# Patient Record
Sex: Female | Born: 1975 | Race: Black or African American | Hispanic: No | Marital: Married | Smoking: Never smoker
Health system: Southern US, Community
[De-identification: ages and names within clinical notes are randomized; demographics above are authoritative.]

## PROBLEM LIST (undated history)

## (undated) DIAGNOSIS — R7303 Prediabetes: Secondary | ICD-10-CM

## (undated) DIAGNOSIS — Z87410 Personal history of cervical dysplasia: Secondary | ICD-10-CM

## (undated) DIAGNOSIS — H04129 Dry eye syndrome of unspecified lacrimal gland: Secondary | ICD-10-CM

## (undated) DIAGNOSIS — G510 Bell's palsy: Secondary | ICD-10-CM

## (undated) DIAGNOSIS — Z87898 Personal history of other specified conditions: Secondary | ICD-10-CM

## (undated) DIAGNOSIS — Z8619 Personal history of other infectious and parasitic diseases: Secondary | ICD-10-CM

## (undated) DIAGNOSIS — K601 Chronic anal fissure: Secondary | ICD-10-CM

## (undated) DIAGNOSIS — E785 Hyperlipidemia, unspecified: Secondary | ICD-10-CM

## (undated) HISTORY — DX: Prediabetes: R73.03

## (undated) HISTORY — DX: Hyperlipidemia, unspecified: E78.5

## (undated) HISTORY — PX: TUBAL LIGATION: SHX77

## (undated) HISTORY — PX: OTHER SURGICAL HISTORY: SHX169

---
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
## 2002-02-18 HISTORY — PX: WISDOM TOOTH EXTRACTION: SHX21

## 2006-01-13 ENCOUNTER — Ambulatory Visit: Payer: Self-pay | Admitting: Internal Medicine

## 2006-01-13 LAB — CONVERTED CEMR LAB
ALT: 16 units/L (ref 0–40)
AST: 19 units/L (ref 0–37)
Albumin: 3.7 g/dL (ref 3.5–5.2)
Alkaline Phosphatase: 35 units/L — ABNORMAL LOW (ref 39–117)
BUN: 14 mg/dL (ref 6–23)
Basophils Absolute: 0 10*3/uL (ref 0.0–0.1)
Basophils Relative: 0.2 % (ref 0.0–1.0)
Bilirubin Urine: NEGATIVE
CO2: 24 meq/L (ref 19–32)
Calcium: 8.8 mg/dL (ref 8.4–10.5)
Chloride: 108 meq/L (ref 96–112)
Chol/HDL Ratio, serum: 3.6
Cholesterol: 239 mg/dL (ref 0–200)
Creatinine, Ser: 0.8 mg/dL (ref 0.4–1.2)
Eosinophil percent: 0.8 % (ref 0.0–5.0)
GFR calc non Af Amer: 91 mL/min
Glomerular Filtration Rate, Af Am: 110 mL/min/{1.73_m2}
Glucose, Bld: 91 mg/dL (ref 70–99)
HCT: 37.5 % (ref 36.0–46.0)
HDL: 65.5 mg/dL (ref >39.0)
Hemoglobin: 12.5 g/dL (ref 12.0–15.0)
Ketones, ur: NEGATIVE mg/dL
LDL DIRECT: 176.1 mg/dL
Leukocytes, UA: NEGATIVE
Lymphocytes Relative: 25.7 % (ref 12.0–46.0)
MCHC: 33.4 g/dL (ref 30.0–36.0)
MCV: 94.6 fL (ref 78.0–100.0)
Monocytes Absolute: 0.3 10*3/uL (ref 0.2–0.7)
Monocytes Relative: 4.8 % (ref 3.0–11.0)
Neutro Abs: 4 10*3/uL (ref 1.4–7.7)
Neutrophils Relative %: 68.5 % (ref 43.0–77.0)
Nitrite: NEGATIVE
Platelets: 235 10*3/uL (ref 150–400)
Potassium: 4.1 meq/L (ref 3.5–5.1)
RBC: 3.97 M/uL (ref 3.87–5.11)
RDW: 12.3 % (ref 11.5–14.6)
Sodium: 140 meq/L (ref 135–145)
Specific Gravity, Urine: 1.02 (ref 1.000–1.03)
TSH: 1.73 microintl units/mL (ref 0.35–5.50)
Total Bilirubin: 0.9 mg/dL (ref 0.3–1.2)
Total Protein, Urine: NEGATIVE mg/dL
Total Protein: 7.1 g/dL (ref 6.0–8.3)
Triglyceride fasting, serum: 53 mg/dL (ref 0–149)
Urine Glucose: NEGATIVE mg/dL
Urobilinogen, UA: 1 (ref 0.0–1.0)
VLDL: 11 mg/dL (ref 0–40)
WBC: 5.8 10*3/uL (ref 4.5–10.5)
pH: 6.5 (ref 5.0–8.0)

## 2006-04-21 ENCOUNTER — Ambulatory Visit: Payer: Self-pay | Admitting: Internal Medicine

## 2006-06-09 ENCOUNTER — Ambulatory Visit: Payer: Self-pay | Admitting: Internal Medicine

## 2006-06-09 LAB — CONVERTED CEMR LAB
ALT: 13 units/L (ref 0–40)
AST: 16 units/L (ref 0–37)
Albumin: 3.4 g/dL — ABNORMAL LOW (ref 3.5–5.2)
Alkaline Phosphatase: 43 units/L (ref 39–117)
BUN: 12 mg/dL (ref 6–23)
Basophils Absolute: 0 10*3/uL (ref 0.0–0.1)
Basophils Relative: 0.1 % (ref 0.0–1.0)
Bilirubin Urine: NEGATIVE
Bilirubin, Direct: 0.1 mg/dL (ref 0.0–0.3)
CO2: 26 meq/L (ref 19–32)
Calcium: 8.8 mg/dL (ref 8.4–10.5)
Chloride: 109 meq/L (ref 96–112)
Cholesterol: 220 mg/dL (ref 0–200)
Creatinine, Ser: 0.7 mg/dL (ref 0.4–1.2)
Crystals: NEGATIVE
Direct LDL: 149 mg/dL
Eosinophils Absolute: 0.1 10*3/uL (ref 0.0–0.6)
Eosinophils Relative: 1.2 % (ref 0.0–5.0)
GFR calc Af Amer: 127 mL/min
GFR calc non Af Amer: 105 mL/min
Glucose, Bld: 84 mg/dL (ref 70–99)
HCT: 34.5 % — ABNORMAL LOW (ref 36.0–46.0)
HDL: 54.7 mg/dL (ref >39.0)
Hemoglobin: 12.1 g/dL (ref 12.0–15.0)
Ketones, ur: NEGATIVE mg/dL
Leukocytes, UA: NEGATIVE
Lymphocytes Relative: 27.7 % (ref 12.0–46.0)
MCHC: 35.1 g/dL (ref 30.0–36.0)
MCV: 93 fL (ref 78.0–100.0)
Monocytes Absolute: 0.3 10*3/uL (ref 0.2–0.7)
Monocytes Relative: 4.4 % (ref 3.0–11.0)
Mucus, UA: NEGATIVE
Neutro Abs: 4.2 10*3/uL (ref 1.4–7.7)
Neutrophils Relative %: 66.6 % (ref 43.0–77.0)
Nitrite: NEGATIVE
Platelets: 180 10*3/uL (ref 150–400)
Potassium: 4.1 meq/L (ref 3.5–5.1)
RBC: 3.71 M/uL — ABNORMAL LOW (ref 3.87–5.11)
RDW: 12.2 % (ref 11.5–14.6)
Sodium: 142 meq/L (ref 135–145)
Specific Gravity, Urine: 1.02 (ref 1.000–1.03)
TSH: 1.7 microintl units/mL (ref 0.35–5.50)
Total Bilirubin: 0.7 mg/dL (ref 0.3–1.2)
Total CHOL/HDL Ratio: 4
Total Protein, Urine: NEGATIVE mg/dL
Total Protein: 6.6 g/dL (ref 6.0–8.3)
Triglycerides: 92 mg/dL (ref 0–149)
Urine Glucose: NEGATIVE mg/dL
Urobilinogen, UA: 0.2 (ref 0.0–1.0)
VLDL: 18 mg/dL (ref 0–40)
WBC, UA: NONE SEEN cells/hpf
WBC: 6.4 10*3/uL (ref 4.5–10.5)
pH: 6.5 (ref 5.0–8.0)

## 2006-06-16 ENCOUNTER — Ambulatory Visit: Payer: Self-pay | Admitting: Internal Medicine

## 2006-11-06 ENCOUNTER — Encounter: Payer: Self-pay | Admitting: Internal Medicine

## 2006-11-06 DIAGNOSIS — K649 Unspecified hemorrhoids: Secondary | ICD-10-CM | POA: Insufficient documentation

## 2006-11-06 DIAGNOSIS — Z8679 Personal history of other diseases of the circulatory system: Secondary | ICD-10-CM | POA: Insufficient documentation

## 2008-05-13 ENCOUNTER — Encounter (INDEPENDENT_AMBULATORY_CARE_PROVIDER_SITE_OTHER): Payer: Self-pay | Admitting: Obstetrics and Gynecology

## 2008-05-13 ENCOUNTER — Inpatient Hospital Stay (HOSPITAL_COMMUNITY): Admission: AD | Admit: 2008-05-13 | Discharge: 2008-05-15 | Payer: Self-pay | Admitting: Obstetrics and Gynecology

## 2008-05-20 ENCOUNTER — Ambulatory Visit: Admission: RE | Admit: 2008-05-20 | Discharge: 2008-05-20 | Payer: Self-pay | Admitting: Obstetrics and Gynecology

## 2008-05-20 ENCOUNTER — Encounter: Admission: RE | Admit: 2008-05-20 | Discharge: 2008-05-27 | Payer: Self-pay | Admitting: Obstetrics and Gynecology

## 2009-06-01 DIAGNOSIS — IMO0002 Reserved for concepts with insufficient information to code with codable children: Secondary | ICD-10-CM | POA: Insufficient documentation

## 2009-06-01 DIAGNOSIS — R87619 Unspecified abnormal cytological findings in specimens from cervix uteri: Secondary | ICD-10-CM | POA: Insufficient documentation

## 2009-07-05 DIAGNOSIS — Z9889 Other specified postprocedural states: Secondary | ICD-10-CM | POA: Insufficient documentation

## 2009-08-04 ENCOUNTER — Ambulatory Visit (HOSPITAL_COMMUNITY): Admission: RE | Admit: 2009-08-04 | Discharge: 2009-08-04 | Payer: Self-pay | Admitting: Obstetrics and Gynecology

## 2010-05-06 LAB — CBC
HCT: 39 % (ref 36.0–46.0)
Hemoglobin: 13.2 g/dL (ref 12.0–15.0)
MCHC: 33.8 g/dL (ref 30.0–36.0)
MCV: 95.6 fL (ref 78.0–100.0)
Platelets: 183 10*3/uL (ref 150–400)
RBC: 4.08 MIL/uL (ref 3.87–5.11)
RDW: 13.3 % (ref 11.5–15.5)
WBC: 6.8 10*3/uL (ref 4.0–10.5)

## 2010-05-31 LAB — CBC
HCT: 28.6 % — ABNORMAL LOW (ref 36.0–46.0)
HCT: 35 % — ABNORMAL LOW (ref 36.0–46.0)
Hemoglobin: 11.9 g/dL — ABNORMAL LOW (ref 12.0–15.0)
Hemoglobin: 9.7 g/dL — ABNORMAL LOW (ref 12.0–15.0)
MCHC: 33.8 g/dL (ref 30.0–36.0)
MCHC: 34 g/dL (ref 30.0–36.0)
MCV: 97.3 fL (ref 78.0–100.0)
MCV: 98 fL (ref 78.0–100.0)
Platelets: 126 10*3/uL — ABNORMAL LOW (ref 150–400)
Platelets: 163 10*3/uL (ref 150–400)
RBC: 2.92 MIL/uL — ABNORMAL LOW (ref 3.87–5.11)
RBC: 3.6 MIL/uL — ABNORMAL LOW (ref 3.87–5.11)
RDW: 13.7 % (ref 11.5–15.5)
RDW: 14.2 % (ref 11.5–15.5)
WBC: 11 10*3/uL — ABNORMAL HIGH (ref 4.0–10.5)
WBC: 9.6 10*3/uL (ref 4.0–10.5)

## 2010-05-31 LAB — RPR: RPR Ser Ql: NONREACTIVE

## 2010-07-03 NOTE — Op Note (Signed)
Veronica Villanueva, Veronica Villanueva               ACCOUNT NO.:  [id number]   MEDICAL RECORD NO.:  [id number]          PATIENT TYPE:  INP   LOCATION:  9104                          [facility]:  [facility]   PHYSICIAN:  Hal Morales, M.D.DATE OF BIRTH:  [birth year]   DATE OF PROCEDURE:  05/13/2008  DATE OF DISCHARGE:                               OPERATIVE REPORT   PREOPERATIVE DIAGNOSIS:  Desire for surgical sterilization.   POSTOPERATIVE DIAGNOSIS:  Desire for surgical sterilization.   OPERATION:  Postpartum tubal sterilization.   SURGEON:  Hal Morales, MD   ANESTHESIA:  Epidural.   ESTIMATED BLOOD LOSS:  Less than 10 mL.   COMPLICATIONS:  None.   FINDINGS:  The tubes appeared normal for the postpartum state.   PROCEDURE:  The patient was taken to the operating room with her labor  epidural in place.  She was placed in the supine position on the  operating table after appropriate identification.  Her labor epidural  was dosed for surgical anesthesia.  The abdomen and perineum were  prepped with multiple layers of Betadine.  The bladder was emptied with  an in-and-out catheter.  The abdomen was draped as a sterile field.  After assurance of adequate anesthesia, subumbilical injection of 10 mL  of 0.25% Marcaine was undertaken.  A subumbilical incision was made.  The peritoneum was entered bluntly and [employer] retractors placed.  The  left fallopian tube was then identified, followed to its fimbriated end,  and grasped at the isthmic portion and elevated.  A suture of 2-0  chromic was placed through the mesosalpinx and tied fore-and-aft on the  knuckle of tube.  A second ligature was placed proximal to that and the  intervening knuckle of tube excised.  The cut ends were cauterized.  A  similar procedure was carried out on the opposite side.  Hemostasis was  noted to be adequate.  The abdominal peritoneum was closed in a  pursestring fashion with 0 Vicryl.  The fascia was closed in a  running  fashion with 0 Vicryl.  The skin incision was closed with a subcuticular  suture of 3-0 Vicryl.  A sterile dressing was applied.  The patient was  taken from the operating room to the recovery room in satisfactory  condition having tolerated the procedure well with sponge and instrument  counts correct.   SPECIMENS TO PATHOLOGY:  Portions of right and left fallopian tube.     Hal Morales, M.D.  Electronically Signed    VPH/MEDQ  D:  05/13/2008  T:  05/14/2008  Job:  [id number]

## 2010-07-03 NOTE — H&P (Signed)
Veronica Villanueva, Veronica Villanueva               ACCOUNT NO.:  [id number]   MEDICAL RECORD NO.:  [id number]          PATIENT TYPE:  INP   LOCATION:  9198                          [facility]  [facility]   PHYSICIAN:  Osborn Coho, M.D.   DATE OF BIRTH:  Mar 14, 1975   DATE OF ADMISSION:  05/13/2008  DATE OF DISCHARGE:                              HISTORY & PHYSICAL   The patient is a 35 year old married African American female, gravida 4,  para 1-0-2-1 at 69 weeks' gestation per an [facility] of May 27, 2008, who  presents with chief complaint of spontaneous rupture of membranes at  4:20 a.m. and subsequent onset of contractions.  She does desire a  bilateral tubal ligation and has 30-day papers on her prenatal record.  She also is requesting an epidural.  History remarkable for first and  second trimester vaginal bleeding.  Denies any vaginal bleeding but did  note some pink tinge to the fluid that she had been leaking; overall,  though, has been clear.  Denies PIH or UTI signs or symptoms, nausea,  vomiting or diarrhea.  Also, no respiratory complaints.  She had been  followed by Dr. Pennie Rushing at [facility] and history has been  remarkable for:  1. A history of postpartum depression which did not require any      medication.  2. History of abnormal Pap 2008; repeat was within normal limits.  3. History of STDs.  4. Hemorrhoids.  5. History of oligohydramnios with her first child and was induced      secondary to that.   PAST MEDICAL HISTORY:  ALLERGIES:  She denies any medication allergies  or latex allergies but does have sensitivity to pineapple and jalapenos.   MENSTRUAL HISTORY:  Menarche at age 12, monthly cycles, does report some  bad cramping since her miscarriage in January 2009.  She had a certain  LMP of August 21, 2007, giving her best [facility] of May 27, 2008, which was  confirmed by an ultrasound October 21, 2007; it gave an [facility] of May 28, 2008.   OBSTETRICAL HISTORY:  Rhett Bannister 1 was  an elective abortion in 1998.  Gravida 2 was a spontaneous vaginal delivery June 2001, a female named  Oretha Caprice, weighed 5 pounds 6 ounces.  Was born somewhere between 36-37  weeks.  The patient is not exactly sure, and she did have an epidural.  She was induced secondary to oligohydramnios.  Gravida 3 was a  spontaneous abortion in January 2009.  Gravida 4 current pregnancy.   PAST MEDICAL HISTORY:  Continued:  She did have postpartum depression  after Darin was born but on no medications.   Contraceptive use in the past:  She had used OCPs and stopped greater  than a year before, her third pregnancy.  Abnormal Pap in 208; repeat  was normal.  Treated for chlamydia in 1996.  Treated for Trichomonas in  the past.  Varicella as a child.  She has had a hemorrhoidectomy 2003.  Wisdom teeth extraction 2004.   FAMILY HISTORY:  Father heart disease and hypertension and is on  medications.  Paternal grandmother:  Insulin-dependent diabetic.  Her  father is a diabetic who takes oral medications.  Paternal grandfather  stroke.  Maternal grandmother:  Breast and stomach cancer.   GENETIC HISTORY:  Is unremarkable.  The patient is 68 years old.  Father  of baby is around 68 years old   SOCIAL HISTORY:  She is a married African American female.  The father  of baby and husband's name is Rilo Mccrystal.  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  Father of the baby  has a high-school diploma and is a Engineer, maintenance (IT).  At her new OB  visit, she denied tobacco or illicit drug.  Had a glass of wine every  other night prior to pregnancy.   HISTORY OF PRESENT PREGNANCY:  She entered care for a new OB interview  September 10.  She was worked up on September 9 for vaginal bleeding by  our family nurse practitioner in the office.  She was having some  difficulty with headaches every night, but they were relieved with  Tylenol.  At her new OB, she weighed 186.  She reported  pre-gravid  weight of 180.  Her BMI was 26.6 which is overweight.  Her height was 5  feet 9 inches.  Voiced desire for M.D. care at her new OB.  She had a  normal Pap April 18, 2007 and declined any cultures that day.   PRENATAL LABORATORY DATA:  Are as follows:  Hemoglobin was 12.6,  hematocrit 38.4, platelets 226.  Blood type is B+, Rh antibody screen  negative.  Sickle cell negative.  RPR nonreactive.  Rubella titer  immune.  Hepatitis surface antigen was indeterminate with her first  draw, and then they did redraw it.  HIV was nonreactive.   At her new OB, she had some lingering nausea, was wearing Sea-Bands,  declined Zofran secondary to her history of hemorrhoids.  Did have some  trouble with lethargy with Phenergan.  She did desire first trimester  screen.  She did have an ultrasound on September 9 because unable to  hear fetal heart tones and had a normal single intrauterine pregnancy;  size was equal to dates.  Her first trimester screen was within normal  limits.  Her AFP was also within normal limits.  She had H1N1 on  November 11.  She had an normal anatomy ultrasound at 18-5/7 weeks, and  size was equal to dates, normal fluid.  Cervix was 3.5 cm, and anatomy  was limited at that time, and ultrasound was repeated at 4 weeks, and  all anatomy was within normal limits with normal growth and development.  She complained of some spotting on December 1 which resolved  spontaneously.  She was seen subsequent to the spotting episode on  December 7, had a negative wet prep.  She had some normal pregnancy  joint pain.  Glucola was at 28 and 3.  She was measuring size greater  than dates beginning late second trimester.  Prescribed Ambien.  Her  ultrasound on January 19 at 28 and 3 showed SIUP, vertex, normal fluid.  Cervix was 4.15 cm.  Growth was measuring 79 percentile, and plan was  made to repeat her ultrasound at 36 weeks.  Her Glucola was within  normal limits, equal to 89.   Hemoglobin was 12.4.  She was seen for the  spotting on December 1 which I did note did subsequently resolve on its  own.  She  signed 30-day papers on February 24 desiring bilateral tubal  ligation postpartum.  Complained of a lot of lower abdominal pressure  around 30 weeks.  Had a negative fetal fibronectin at 31 and 4.  She  continued measuring size greater than dates.  She was prescribed  Procardia for the pelvic pain to take p.r.n. contractions.  Had some  difficulty with some right lower quadrant pain, especially with walking,  difficulty driving 45 minutes to work.  She was also prescribed some  Flexeril to use p.r.n. and was referred for a PT consult.  I do not have  her last date of her prenatal record, but she does report that she did  have an ultrasound around 36 weeks, and they had difficulty getting a  good weight on the baby secondary to it being low in the pelvis with its  station, but she said it was a little over 7 pounds per her report.  She  has not had any other complications to report.  Her GBS culture was  positive in third trimester.   OBJECTIVE:  VITAL SIGNS:  On admission 134/76, heart rate was 89,  respirations 20, temperature was 98 degrees and that was orally.  Fetal  heart rate 140, moderate variability.  Had some brief mild variables  intermittently with some of her contractions but tracing is reactive and  overall reassuring.  Toco showing uterine contractions every 2 to 3-1/2  minutes, moderate on palpation.  GENERAL:  She is alert and oriented x3.  Does have some labored  breathing and grimace discomfort with no contractions.  HEENT:  Grossly intact and within normal limits.  CARDIOVASCULAR:  Regular rate and rhythm without murmur.  LUNGS:  Clear to auscultation bilaterally.  ABDOMEN:  Soft, nontender, gravid.  STERILE SPECULUM EXAM:  Positive clear pooling.  Cervix 3, 90, -1,  vertex, questionable forebag was palpated.  EXTREMITIES:  1+ pitting  bilateral lower extremities and generalized  edema in her upper extremities.  No clonus and DTRs were 1+.   IMPRESSION:  1. Intrauterine pregnancy at 38 weeks.  2. GBS positive.  3. Early labor with rupture of membranes 4:20 a.m.  4. Reactive fetal heart tracing.   PLAN:  1. Admit to birthing suites with Dr. Su Hilt as attending.  2. Routine L and D orders.  3. Penicillin G IV per GBS protocol.  4. Epidural on request.  5. M.D. to follow.      Candice [place], [place]      Osborn Coho, M.D.  Electronically Signed    CHS/MEDQ  D:  05/13/2008  T:  05/13/2008  Job:  [id number]

## 2010-07-03 NOTE — Discharge Summary (Signed)
NAMESARON, VANORMAN               ACCOUNT NO.:  [id number]   MEDICAL RECORD NO.:  [id number]          PATIENT TYPE:  INP   LOCATION:  9104                          [facility]   PHYSICIAN:  Hal Morales, M.D.DATE OF BIRTH:  [birth year]   DATE OF ADMISSION:  05/13/2008  DATE OF DISCHARGE:  05/15/2008                               DISCHARGE SUMMARY   ADMITTING DIAGNOSES:  1. Intrauterine pregnancy at 38 weeks.  2. Group B streptococcus positive.  3. Early labor with rupture of membranes at 4:20 a.m. on May 13, 2008.  4. Reactive fetal heart tracing.   DISCHARGE DIAGNOSES:  1. Intrauterine pregnancy at 38 weeks.  2. Group B streptococcus positive.  3. Early labor with rupture of membranes at 4:20 a.m. on May 13, 2008.  4. Reactive fetal heart tracing.  5. Status post spontaneous vaginal delivery on May 13, 2008, at      10:50 a.m., left occiput posterior position.  6. Thrombocytopenia, postpartum.  7. Slight anemia without signs or symptoms.  8. History of postpartum depression, but stable status post delivery      thus far.   FINDINGS:  Viable female infant, named Reuel Boom, weighing 7 pounds 11 ounces  (3495 g), length included 20.75 inches with Apgars of 9 at one minute  and 9 at 5 minutes, also status post postpartum bilateral tubal  ligation, continue breastfeeding.   HOSPITAL PROCEDURES:  1. Epidural anesthesia.  2. Bilateral tubal ligation, postpartum, which was on May 13, 2008,      following delivery.   HOSPITAL COURSE:  Ms. Karl is a 35 year old married African American  female, gravida 4, para 1-0-2-1, presented on the date of admission at  26 weeks' gestation for an [facility] of May 27, 2008, with a chief complaint  of spontaneous rupture of membranes at 4:20 a.m. and subsequent onset of  contractions on arrival.  She had positive pooling, confirming rupture  of membranes, and cervix was 3 cm, 90% effaced, and -1 station.  Vertex  presentation.   She still voiced desire for epidural as well as  postpartum bilateral tubal ligation on admission.  She was admitted and  penicillin prophylaxis was started per GBS protocol.  She was afebrile.  Her vital signs were stable.  She has been followed by Dr. Pennie Rushing at  [facility].   Her history had been remarkable for;  1. History of postpartum depression with her first pregnancy, but no      medications.  2. History of abnormal Pap.  3. History of STDs.  4. History of hemorrhoids.  5. History of oligo with her first child and was induced with the      child for that reason.  6. First and second trimester vaginal bleeding during the pregnancy.   The patient's labor continued to progress spontaneously.  Around 10  a.m., cervix was 4-5 cm by RN, contracting every 2 minutes, was having  some difficulty with epidural, still some cramping pain even after  obtaining that, and labor continued to progress spontaneously, and the  patient delivered via normal sterile vaginal delivery at 10:50 a.m.,  viable female infant, Reuel Boom, weighing 7 pounds 11 ounces.  Apgars were 9  at one minute and 9 at five minutes.  EBL was less than 500, did have  repair of a first-degree mid line laceration by Dr. Pennie Rushing, was  consented for postpartum BTL, which she was taken to following delivery.  The patient tolerated the procedure well.  Epidural anesthesia, which  had been inserted during labor, was used during the procedure.  Minimal  blood loss.  No complications.  By postpartum and postoperative day #1,  the patient was doing well.  She was up ad lib.  She was breastfeeding.  She denied any postpartum depression.  No syncope or dizziness.  She was  afebrile.  Vital signs were stable.  CBC on postoperative day #1,  hemoglobin was down to 9.7 from 11.9, white count was 11 which is up  from 9.6, platelets were down to 126 from 163.  They had been normal at  her new OB visit.   Her physical exam was within normal  limits.  She had scant lochia.  Fundus was firm.  A plan was made to start her on iron once a day and  check orthostatic vital signs and routine postpartum care continued.  She was given Motrin and Percocet prescriptions that day by Chip Boer L.  [place], C.N.M. for discharge anticipated the next day.  On postpartum  day #2, the patient was doing well, ready for discharge, improved calf  pain.  She did state no bowel movement since delivery, but was urged,  did voice desire for stimulant prior to discharge.  She has been up ad  lib.  No dizziness or syncope.  Pain controlled with Motrin and p.r.n.  Percocet, tolerating regular diet, and breastfeeding is going well.  She  remains afebrile and vital signs stable.  Orthostatic vital signs on day  #1, lying was 132/74 and heart rate 84; sitting 118/71 and heart rate  77; standing was 117/71 and heart rate was 87.  Physical exam remained  within normal limits.  Abdomen was soft and nontender.  Fundus was firm  below umbilicus.  Incision was opened to air and no signs or symptoms of  infection.  She had small rubra lochia.  Extremities have 1+ edema,  bilateral lower, and some generalized edema in her arms and hands, which  were congruent with the impression at admission.  Negative Homans sign.  The patient was deemed to have received the full benefit of her hospital  stay and was discharged home in stable condition.  Discharge  instructions per [facility] pamphlet.  Warning signs and symptoms to report  were reviewed.  She reports that she has already made a followup 6-week  appointment with Dr. Pennie Rushing.   DISCHARGE MEDICATIONS:  1. Motrin 600 mg p.o. q.6 h. p.r.n. pain.  2. Percocet 5/325 one tab p.o. q.4 h. p.r.n. moderate-to-severe pain,      or may take 2 tablets p.o. q.6 h. p.r.n. moderate-to-severe pain.      She is to obtain Slow FE iron supplement over-the-counter one tab      p.o. daily.  She is also going to obtain Colace or other stool       softener 1 to 2 tabs p.o. daily, p.r.n. constipation, discussed      MiraLax to take at home, and also to continue prenatal vitamins.   PLAN:  To also check CBC at her postpartum  appointment secondary to  thrombocytopenia.      Candice [place], [place]      Hal Morales, M.D.  Electronically Signed    CHS/MEDQ  D:  05/15/2008  T:  05/15/2008  Job:  [id number]

## 2011-05-06 ENCOUNTER — Ambulatory Visit: Payer: Self-pay | Admitting: Family Medicine

## 2011-07-05 ENCOUNTER — Telehealth: Payer: Self-pay | Admitting: Obstetrics and Gynecology

## 2011-07-05 NOTE — Telephone Encounter (Signed)
chandra/epic 

## 2011-07-19 ENCOUNTER — Encounter: Payer: Self-pay | Admitting: Obstetrics and Gynecology

## 2011-07-23 ENCOUNTER — Encounter: Payer: Self-pay | Admitting: Obstetrics and Gynecology

## 2011-07-23 ENCOUNTER — Ambulatory Visit (INDEPENDENT_AMBULATORY_CARE_PROVIDER_SITE_OTHER): Payer: 59 | Admitting: Obstetrics and Gynecology

## 2011-07-23 VITALS — BP 118/70 | Resp 18 | Ht 66.0 in | Wt 195.0 lb

## 2011-07-23 DIAGNOSIS — Z202 Contact with and (suspected) exposure to infections with a predominantly sexual mode of transmission: Secondary | ICD-10-CM

## 2011-07-23 DIAGNOSIS — Z124 Encounter for screening for malignant neoplasm of cervix: Secondary | ICD-10-CM

## 2011-07-23 DIAGNOSIS — Z2089 Contact with and (suspected) exposure to other communicable diseases: Secondary | ICD-10-CM

## 2011-07-23 NOTE — Progress Notes (Signed)
Regular Periods: yes Mammogram: no  Monthly Breast Ex.: yes Exercise: yes  Tetanus < 10 years: yes Seatbelts: yes  NI. Bladder Functn.: yes Abuse at home: no  Daily BM's: yes Stressful Work: yes  Healthy Diet: yes Sigmoid-Colonoscopy: n/a  Calcium: no Medical problems this year: no concerns    LAST PAP:02/20/2011 WNL   Contraception: BTL  Mammogram:  n/a  PCP: [facility]   PMH: prediabetic 1 month ago  FMH: no changes   Last Bone Scan: n/a    *Veronica Villanueva attempted blood draw for STD testing; however, Veronica Villanueva was unsuccessful. Pt will go to [employer] [facility] this afternoon for STD testing.

## 2011-07-23 NOTE — Progress Notes (Signed)
Subjective:    Veronica Villanueva is a 36 y.o. female, [phone], who presents for an annual exam.     History   Social History  . Marital Status: Married    Spouse Name: N/A    Number of Children: N/A  . Years of Education: N/A   Social History Main Topics  . Smoking status: Never Smoker   . Smokeless tobacco: Never Used  . Alcohol Use: 0.5 oz/week    1 drink(s) per week  . Drug Use: No  . Sexually Active: Yes    Birth Control/ Protection: Surgical     BTL   Other Topics Concern  . None   Social History Narrative  . None    Menstrual cycle:   LMP: Patient's last menstrual period was 07/02/2011.           Cycle: regular  The following portions of the patient's history were reviewed and updated as appropriate: allergies, current medications, past family history, past medical history, past social history, past surgical history and problem list.  Review of Systems Pertinent items are noted in HPI. Breast:Negative for breast lump,nipple discharge or nipple retraction Gastrointestinal: Negative for abdominal pain, change in bowel habits or rectal bleeding Urinary:negative   Objective:    BP 118/70  Resp 18  Wt 195 lb (88.451 kg)  LMP 07/02/2011    Weight:  Wt Readings from Last 1 Encounters:  07/23/11 195 lb (88.451 kg)          BMI: There is no height on file to calculate BMI.  General Appearance: Alert, appropriate appearance for age. No acute distress HEENT: Grossly normal Neck / Thyroid: Supple, no masses, nodes or enlargement Lungs: clear to auscultation bilaterally Back: No CVA tenderness Breast Exam: No masses or nodes.No dimpling, nipple retraction or discharge. Cardiovascular: Regular rate and rhythm. S1, S2, no murmur Gastrointestinal: Soft, non-tender, no masses or organomegaly Pelvic Exam: Vulva and vagina appear normal. Bimanual exam reveals normal uterus and adnexa. Cervix: normal appearance and thin prep PAP obtained Rectovaginal: not  indicated Lymphatic Exam: Non-palpable nodes in neck, clavicular, axillary, or inguinal regions Skin: no rash or abnormalities Neurologic: Normal gait and speech, no tremor  Psychiatric: Alert and oriented, appropriate affect.   Wet Prep:not applicable Urinalysis:not applicable UPT: Not done   Assessment:    Normal gyn exam    Plan:    pap smear return annually or prn STD screening: done Contraception:bilateral tubal ligation      Tersea Aulds M

## 2011-07-24 ENCOUNTER — Encounter: Payer: Self-pay | Admitting: Obstetrics and Gynecology

## 2011-07-26 LAB — PAP IG, CT-NG, RFX HPV ASCU
Chlamydia Probe Amp: NEGATIVE
GC Probe Amp: NEGATIVE

## 2011-07-27 LAB — RPR

## 2011-07-27 LAB — HIV ANTIBODY (ROUTINE TESTING W REFLEX): HIV: NONREACTIVE

## 2011-07-27 LAB — HEPATITIS C ANTIBODY: HCV Ab: NEGATIVE

## 2011-07-27 LAB — HEPATITIS B SURFACE ANTIGEN: Hepatitis B Surface Ag: NEGATIVE

## 2011-07-29 LAB — HSV 1 ANTIBODY, IGG: HSV 1 Glycoprotein G Ab, IgG: <0.1 IV

## 2011-07-29 LAB — HSV 2 ANTIBODY, IGG: HSV 2 Glycoprotein G Ab, IgG: <0.1 IV

## 2011-07-31 ENCOUNTER — Telehealth: Payer: Self-pay

## 2011-07-31 ENCOUNTER — Ambulatory Visit (INDEPENDENT_AMBULATORY_CARE_PROVIDER_SITE_OTHER): Payer: 59 | Admitting: Obstetrics and Gynecology

## 2011-07-31 ENCOUNTER — Telehealth: Payer: Self-pay | Admitting: Obstetrics and Gynecology

## 2011-07-31 ENCOUNTER — Encounter: Payer: Self-pay | Admitting: Obstetrics and Gynecology

## 2011-07-31 VITALS — BP 110/62 | HR 80 | Ht 69.0 in | Wt 190.0 lb

## 2011-07-31 DIAGNOSIS — R87612 Low grade squamous intraepithelial lesion on cytologic smear of cervix (LGSIL): Secondary | ICD-10-CM | POA: Insufficient documentation

## 2011-07-31 DIAGNOSIS — IMO0002 Reserved for concepts with insufficient information to code with codable children: Secondary | ICD-10-CM

## 2011-07-31 DIAGNOSIS — Z9889 Other specified postprocedural states: Secondary | ICD-10-CM

## 2011-07-31 DIAGNOSIS — B977 Papillomavirus as the cause of diseases classified elsewhere: Secondary | ICD-10-CM | POA: Insufficient documentation

## 2011-07-31 DIAGNOSIS — R6889 Other general symptoms and signs: Secondary | ICD-10-CM

## 2011-07-31 DIAGNOSIS — O4100X0 Oligohydramnios, unspecified trimester, not applicable or unspecified: Secondary | ICD-10-CM | POA: Insufficient documentation

## 2011-07-31 DIAGNOSIS — A599 Trichomoniasis, unspecified: Secondary | ICD-10-CM | POA: Insufficient documentation

## 2011-07-31 LAB — POCT URINE PREGNANCY: Preg Test, Ur: NEGATIVE

## 2011-07-31 NOTE — Telephone Encounter (Signed)
TC from pt.  Has colpo scheduled today.  Questioning if should take med prior to procedure.  Instructed may take Ibuporfen  600mg  1 hour prior.  Pt verbalizes comprehension.

## 2011-07-31 NOTE — Progress Notes (Addendum)
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 Previous Colposcopy: 07/04/2009 Referred From: [facility] LMP: 07/02/2011 Contraception: BTL G,P: 4:2

## 2011-07-31 NOTE — Telephone Encounter (Signed)
Message copied by Janeece Agee on Wed Jul 31, 2011  8:59 AM ------      Message from: Malissa Hippo      Created: Sat Jul 27, 2011  2:54 AM      Regarding: please sched for colpo       Abnormal pap, please sched for colpo      SL                  ----- Message -----         From: [facility] Interface         Sent: 07/24/2011   3:33 PM           To: Malissa Hippo, CNM

## 2011-07-31 NOTE — Telephone Encounter (Signed)
Spoke with pt informing her pap results are abnormal & per SL colposcopy is needed. Pt states she is about to lose her job and her insurance and request colpo before the 23rd. There are no colpo openings next week. Informed pt will have to consult with MD & supervisor to see what we can do. Spoke with Marylene Land and Dr. AVS pt can come today @ 3:30 pm for colpo. Pt denies anything in vulva area last 24 hours and informed to take 600 mg Ibuprofen an hour before the procedure. Pt agrees and voices understanding.

## 2011-07-31 NOTE — Progress Notes (Signed)
HISTORY OF PRESENT ILLNESS  Ms. Veronica Villanueva is a 36 y.o. year old female,G4P2022, who presents for a problem visit. She had colposcopy, biopsies, and a subsequent conization of the cervix in 2011 because of a high grade lesion.  Her Pap smear last month showed a low-grade squamous intraepithelial lesion.  Her screening tests for sexual transmitted infections were negative.  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  Subjective:  Nervous about exam.  Objective:  BP 110/62  Pulse 80  Ht 5\' 9"  (1.753 m)  Wt 190 lb (86.183 kg)  BMI 28.06 kg/m2  LMP 07/02/2011   General: no distress  External genitalia: normal general appearance Vaginal: normal without tenderness, induration or masses and relaxation noted Cervix: normal appearance and see colposcopy procedure Adnexa: normal bimanual exam Uterus: upper limits normal size shape and consistency  Pregnancy test is negative  Colposcopy procedure:  Speculum exam performed.  The cervix was prepped with acetic acid.  Hurricaine gel was placed on the cervix.  Colposcopy was performed.  White epithelium was noted at the 6 o'clock position.  The cervix was gently dilated.  An endocervical curettage was obtained.  No lesions were appreciated in the endocervical canal.  A biopsy was obtained from the 6 o'clock position on the cervix.  Hemostasis was adequate.  Tolerated well.  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  Return to office in 2 week(s).   Leonard Schwartz M.D.  07/31/2011 5:59 PM

## 2011-08-02 LAB — PATHOLOGY

## 2011-08-07 ENCOUNTER — Telehealth: Payer: Self-pay | Admitting: Obstetrics and Gynecology

## 2011-08-07 NOTE — Telephone Encounter (Signed)
Triage/tst res/elect

## 2011-08-07 NOTE — Telephone Encounter (Signed)
Tc to pt per telephone call. Pt request colpo results. Told pt colpo-negative for cancer. Will consult with AVS per additional recs rgdg plan of care. Pt voices understanding.

## 2011-08-14 ENCOUNTER — Telehealth: Payer: Self-pay | Admitting: Obstetrics and Gynecology

## 2011-08-14 NOTE — Telephone Encounter (Signed)
Pt wants lab results 

## 2011-08-15 ENCOUNTER — Encounter: Payer: 59 | Admitting: Obstetrics and Gynecology

## 2011-08-16 NOTE — Telephone Encounter (Signed)
Tc from pt per telephone call. Pt aware awaiting POC per avs rgdg colposcopy results. Will call pt to inform after response per avs. Pt voices understanding.

## 2011-08-16 NOTE — Telephone Encounter (Signed)
Lm on vm to cb per telephone call requesting test results.

## 2011-08-19 NOTE — Telephone Encounter (Signed)
Tc from pt per telephone call. Pt to follow up in 6 months for repeat pap. Pt voices understanding. AEX due 07/2012. Pt voices understanding and put on recall list for 6 month reminder.

## 2011-08-19 NOTE — Telephone Encounter (Signed)
Tc to pt per avs recs rgdg colpo. No evidence of malignancy. Pt to sched AEX with avs for follow-up. Lm on vm for pt to cb.

## 2011-08-26 ENCOUNTER — Other Ambulatory Visit: Payer: Self-pay

## 2011-08-26 MED ORDER — FOLIC ACID 1 MG PO TABS: 1.0000 mg | ORAL_TABLET | Freq: Every day | ORAL | Status: DC

## 2011-09-18 ENCOUNTER — Ambulatory Visit: Payer: Self-pay | Admitting: Obstetrics and Gynecology

## 2011-12-28 ENCOUNTER — Ambulatory Visit (INDEPENDENT_AMBULATORY_CARE_PROVIDER_SITE_OTHER): Payer: BC Managed Care – PPO | Admitting: Physician Assistant

## 2011-12-28 VITALS — BP 116/72 | HR 80 | Temp 98.7°F | Resp 18 | Ht 68.75 in | Wt 195.2 lb

## 2011-12-28 DIAGNOSIS — J329 Chronic sinusitis, unspecified: Secondary | ICD-10-CM

## 2011-12-28 DIAGNOSIS — R599 Enlarged lymph nodes, unspecified: Secondary | ICD-10-CM

## 2011-12-28 DIAGNOSIS — J3489 Other specified disorders of nose and nasal sinuses: Secondary | ICD-10-CM

## 2011-12-28 DIAGNOSIS — R0982 Postnasal drip: Secondary | ICD-10-CM

## 2011-12-28 DIAGNOSIS — R0981 Nasal congestion: Secondary | ICD-10-CM

## 2011-12-28 DIAGNOSIS — R590 Localized enlarged lymph nodes: Secondary | ICD-10-CM

## 2011-12-28 DIAGNOSIS — J029 Acute pharyngitis, unspecified: Secondary | ICD-10-CM

## 2011-12-28 LAB — POCT RAPID STREP A (OFFICE): Rapid Strep A Screen: NEGATIVE

## 2011-12-28 MED ORDER — IPRATROPIUM BROMIDE 0.03 % NA SOLN: 2.0000 | Freq: Two times a day (BID) | NASAL | Status: DC

## 2011-12-28 NOTE — Progress Notes (Signed)
  Subjective:    Patient ID: Veronica Villanueva, female    DOB: 10/25/1975, 36 y.o.   MRN: [id number]  HPI  [name] is a 36 yr old female here with 5 days of sore throat.  Symptoms began Monday with HA and sore throat.  Has been taking Alka Seltzer Plus Cold with some relief.  States she is "coughing up cold" in her throat.  Denies chest congestion.  States this might be post-nasal drainage but she's not sure.  Denies sinus pressure or tooth pain.  No ear pain.  No fever.chills, NVD.  Son has also been sick this week.  Has had flu shot.  Concerned that she has either strep or sinus infection.      Review of Systems  Constitutional: Negative for fever and chills.  HENT: Positive for congestion, sore throat, rhinorrhea and postnasal drip. Negative for ear pain, drooling and sinus pressure.   Respiratory: Positive for cough. Negative for shortness of breath and wheezing.   Cardiovascular: Negative.   Gastrointestinal: Negative.   Musculoskeletal: Negative.   Skin: Negative.   Neurological: Positive for headaches. Negative for dizziness, syncope and light-headedness.       Objective:   Physical Exam  Vitals reviewed. Constitutional: She appears well-developed and well-nourished. No distress.  HENT:  Head: Normocephalic and atraumatic.  Right Ear: Tympanic membrane and ear canal normal.  Left Ear: Tympanic membrane and ear canal normal.  Nose: Nose normal. Right sinus exhibits no maxillary sinus tenderness and no frontal sinus tenderness. Left sinus exhibits no maxillary sinus tenderness and no frontal sinus tenderness.  Mouth/Throat: Uvula is midline, oropharynx is clear and moist and mucous membranes are normal. No oropharyngeal exudate or posterior oropharyngeal erythema.  Neck: Neck supple.  Cardiovascular: Normal rate, regular rhythm and normal heart sounds.   Pulmonary/Chest: Breath sounds normal. She has no wheezes. She has no rales.  Lymphadenopathy:    She has no cervical  adenopathy.  Neurological: She is alert.  Skin: Skin is warm and dry.  Psychiatric: She has a normal mood and affect. Her behavior is normal.     Filed Vitals:   12/28/11 0959  BP: 116/72  Pulse: 80  Temp: 98.7 F (37.1 C)  Resp: 18      Results for orders placed in visit on 12/28/11  POCT RAPID STREP A (OFFICE)      Component Value Range   Rapid Strep A Screen Negative  Negative        Assessment & Plan:   1. Sore throat  POCT rapid strep A  2. Nasal congestion  ipratropium (ATROVENT) 0.03 % nasal spray  3. Cervical lymphadenopathy    4. Post-nasal drainage      [name] is a 36 yr old female with 5 days of URI symptoms.  Pt was concerned for strep or sinus infection.  Rapid strep is negative.  She is afebrile and without sinus tenderness or tooth pain.  Suspect viral etiology.  Will treat symptoms with Atrovent nasal spray and OTC Zyrtec.  Encouraged ibuprofen for sore throat.  Discussed RTC precautions.  She will return if worsening or not improving.

## 2011-12-28 NOTE — Patient Instructions (Signed)
Viral Pharyngitis  Viral pharyngitis is a viral infection that produces redness, pain, and swelling (inflammation) of the throat. It can spread from person to person (contagious).  CAUSES  Viral pharyngitis is caused by inhaling a large amount of certain germs called viruses. Many different viruses cause viral pharyngitis.  SYMPTOMS  Symptoms of viral pharyngitis include:   Sore throat.   Tiredness.   Stuffy nose.   Low-grade fever.   Congestion.   Cough.  TREATMENT  Treatment includes rest, drinking plenty of fluids, and the use of over-the-counter medication (approved by your caregiver).  HOME CARE INSTRUCTIONS    Drink enough fluids to keep your urine clear or pale yellow.   Eat soft, cold foods such as ice cream, frozen ice pops, or gelatin dessert.   Gargle with warm salt water (1 tsp salt per 1 qt of water).   If over age 7, throat lozenges may be used safely.   Only take over-the-counter or prescription medicines for pain, discomfort, or fever as directed by your caregiver. Do not take aspirin.  To help prevent spreading viral pharyngitis to others, avoid:   Mouth-to-mouth contact with others.   Sharing utensils for eating and drinking.   Coughing around others.  SEEK MEDICAL CARE IF:    You are better in a few days, then become worse.   You have a fever or pain not helped by pain medicines.   There are any other changes that concern you.  Document Released: 11/14/2004 Document Revised: 04/29/2011 Document Reviewed: 04/12/2010  [employer] Patient Information 2013 [employer].

## 2012-02-20 ENCOUNTER — Telehealth: Payer: Self-pay | Admitting: Obstetrics and Gynecology

## 2012-02-20 NOTE — Telephone Encounter (Signed)
VM from pt.  States needs to have Rx to insert for dilation prior to annual 03/10/12 with Dr Alinda Sierras.

## 2012-02-20 NOTE — Telephone Encounter (Signed)
Request to EP to RF this pt's cytotec, per ND. EP Rx'd it last. Pt has hx cx stenosis. Veronica Villanueva. Per EP this is ok. When I called her pharmacy, they said they had it ready for her already, because they found that she had 1 remaining refill. Pt was notified. Veronica Villanueva

## 2012-03-05 ENCOUNTER — Ambulatory Visit: Payer: 59 | Admitting: Obstetrics and Gynecology

## 2012-03-10 ENCOUNTER — Ambulatory Visit: Payer: 59 | Admitting: Obstetrics and Gynecology

## 2012-03-23 ENCOUNTER — Telehealth: Payer: Self-pay | Admitting: Obstetrics and Gynecology

## 2012-03-23 NOTE — Telephone Encounter (Signed)
Pt called, has an AEX on 03/26/12 w/ AR and needs to insert the cytotec prior to coming in d/t her cervical stenosis but unsure when to insert it.  She only has one pill.  Pt advised to moisten pill and insert into the vagina at least 4 hours prior to appt, pt voices agreement.

## 2012-03-26 ENCOUNTER — Encounter: Payer: Self-pay | Admitting: Obstetrics and Gynecology

## 2012-03-26 ENCOUNTER — Ambulatory Visit: Payer: Commercial Indemnity | Admitting: Obstetrics and Gynecology

## 2012-03-26 VITALS — BP 120/62 | HR 70 | Resp 16 | Ht 69.0 in | Wt 197.0 lb

## 2012-03-26 DIAGNOSIS — Z124 Encounter for screening for malignant neoplasm of cervix: Secondary | ICD-10-CM

## 2012-03-26 DIAGNOSIS — R87612 Low grade squamous intraepithelial lesion on cytologic smear of cervix (LGSIL): Secondary | ICD-10-CM

## 2012-03-26 DIAGNOSIS — Z01419 Encounter for gynecological examination (general) (routine) without abnormal findings: Secondary | ICD-10-CM

## 2012-03-26 DIAGNOSIS — K649 Unspecified hemorrhoids: Secondary | ICD-10-CM

## 2012-03-26 MED ORDER — HYDROCORTISONE ACETATE 25 MG RE SUPP: 25.0000 mg | Freq: Two times a day (BID) | RECTAL | Status: DC | PRN

## 2012-03-26 NOTE — Progress Notes (Signed)
Contraception BTL Last pap 02/20/2011 WNL Last Mammo None Last Colonoscopy None Last Dexa Scan None Primary MD Ailene Ards Abuse at Home None  No complaints.  May d/c folic acid.    Filed Vitals:   03/26/12 1450  BP: 120/62  Pulse: 70  Resp: 16   ROS: noncontributory  Physical Examination: General appearance - alert, well appearing, and in no distress Neck - supple, no significant adenopathy Chest - clear to auscultation, no wheezes, rales or rhonchi, symmetric air entry Heart - normal rate and regular rhythm Abdomen - soft, nontender, nondistended, no masses or organomegaly Breasts - breasts appear normal, no suspicious masses, no skin or nipple changes or axillary nodes Pelvic - normal external genitalia, vulva, vagina, cervix, uterus and adnexa Back exam - no CVAT Extremities - no edema, redness or tenderness in the calves or thighs  A/P Pap today - If nl, return to annual testing. If abnl, repeat colposcopy. 18yr for AEX Hemorrhoidal supp refill per request

## 2012-03-30 LAB — PAP IG W/ RFLX HPV ASCU

## 2012-03-31 ENCOUNTER — Ambulatory Visit: Payer: 59 | Admitting: Obstetrics and Gynecology

## 2012-05-13 ENCOUNTER — Ambulatory Visit (INDEPENDENT_AMBULATORY_CARE_PROVIDER_SITE_OTHER): Payer: Self-pay | Admitting: *Deleted

## 2012-05-13 DIAGNOSIS — I781 Nevus, non-neoplastic: Secondary | ICD-10-CM

## 2012-05-13 NOTE — Progress Notes (Signed)
X=.3% Sotradecol administered with a 27g butterfly.  Patient received a total of 6cc.  Able to treat all vessels with one syringe. Pt tol well and easy access. Antic good results. Follow prn  Photos: yes  Compression stockings applied: yes

## 2012-05-20 ENCOUNTER — Ambulatory Visit: Payer: Self-pay

## 2012-05-20 ENCOUNTER — Ambulatory Visit: Payer: Self-pay | Admitting: *Deleted

## 2012-06-22 ENCOUNTER — Encounter (INDEPENDENT_AMBULATORY_CARE_PROVIDER_SITE_OTHER): Payer: Self-pay

## 2012-06-22 DIAGNOSIS — I83893 Varicose veins of bilateral lower extremities with other complications: Secondary | ICD-10-CM

## 2012-07-27 ENCOUNTER — Encounter: Payer: Self-pay | Admitting: Gastroenterology

## 2012-08-24 ENCOUNTER — Encounter: Payer: Self-pay | Admitting: Gastroenterology

## 2012-08-24 ENCOUNTER — Ambulatory Visit (INDEPENDENT_AMBULATORY_CARE_PROVIDER_SITE_OTHER): Payer: PRIVATE HEALTH INSURANCE | Admitting: Gastroenterology

## 2012-08-24 VITALS — BP 106/60 | HR 80 | Ht 68.0 in | Wt 201.2 lb

## 2012-08-24 DIAGNOSIS — K649 Unspecified hemorrhoids: Secondary | ICD-10-CM

## 2012-08-24 NOTE — Progress Notes (Signed)
History of Present Illness: Pleasant 37 year old Afro-American female referred for evaluation of hemorrhoids. This has been an ongoing problem for years. She's taking various suppository and undergone laser therapy in the past. She continues to be symptomatic daily with pain and itching. She denies bleeding. She moves her bowels regularly. 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    Past Medical History  Diagnosis Date  . 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 Trichomonas     treated  . Oligohydramnios     with first pregnancy   . Hyperlipidemia   . Rectal fistula    Past Surgical History  Procedure Laterality Date  . Tubal ligation    . Hemorrhoid surgery  2003    with fistula surgery  . Wisdom tooth extraction  2004   family history includes Breast cancer in her maternal grandmother; Diabetes in her father and paternal grandmother; Heart disease in her father; Hypertension in her father; Stomach cancer in her maternal grandmother; and Stroke in her paternal grandfather. Current Outpatient Prescriptions  Medication Sig Dispense Refill  . hydrocortisone (ANUSOL-HC) 25 MG suppository Place 1 suppository (25 mg total) rectally 2 (two) times daily as needed for hemorrhoids.  12 suppository  0  . SIMVASTATIN PO Take 1 tablet by mouth daily.        No current facility-administered medications for this visit.   Allergies as of 08/24/2012  . (No Known Allergies)    reports that she has never smoked. She has never used smokeless tobacco. She reports that she drinks about 0.5 ounces of alcohol per week. She reports that she does not use illicit drugs.     Review of Systems: Pertinent positive and negative review of systems were noted in the above HPI section. All other review of systems were otherwise negative.  Vital signs were reviewed in today's medical record Physical Exam: General:  Well developed , well nourished, no acute distress Skin: anicteric Head: Normocephalic and atraumatic Eyes:  sclerae anicteric, EOMI Ears: Normal auditory acuity Mouth: No deformity or lesions Neck: Supple, no masses or thyromegaly Lungs: Clear throughout to auscultation Heart: Regular rate and rhythm; no murmurs, rubs or bruits Abdomen: Soft, non tender and non distended. No masses, hepatosplenomegaly or hernias noted. Normal Bowel sounds Rectal: 2 small external skin tags are present. Musculoskeletal: Symmetrical with no gross deformities  Skin: No lesions on visible extremities Pulses:  Normal pulses noted Extremities: No clubbing, cyanosis, edema or deformities noted Neurological: Alert oriented x 4, grossly nonfocal Cervical Nodes:  No significant cervical adenopathy Inguinal Nodes: No significant inguinal adenopathy Psychological:  Alert and cooperative. Normal mood and affect

## 2012-08-24 NOTE — Assessment & Plan Note (Signed)
Patient has systematic hemorrhoids despite medical therapy. Plan band ligation.

## 2012-08-24 NOTE — Patient Instructions (Addendum)
We are renewing your medication We have put you on our list for the hemorrhoidal banding in our office

## 2012-08-26 ENCOUNTER — Telehealth: Payer: Self-pay | Admitting: Gastroenterology

## 2012-08-26 MED ORDER — HYDROCORTISONE ACETATE 25 MG RE SUPP: 25.0000 mg | Freq: Two times a day (BID) | RECTAL | Status: DC | PRN

## 2012-08-26 NOTE — Telephone Encounter (Signed)
Called and l/m that med was sent  To her pharmacy

## 2012-09-05 ENCOUNTER — Other Ambulatory Visit: Payer: Self-pay | Admitting: Family Medicine

## 2012-09-07 NOTE — Telephone Encounter (Signed)
Needs OV, labs 

## 2012-09-16 ENCOUNTER — Emergency Department (HOSPITAL_COMMUNITY)
Admission: EM | Admit: 2012-09-16 | Discharge: 2012-09-16 | Disposition: A | Discharge status: Home / Self Care | Payer: PRIVATE HEALTH INSURANCE | Attending: Emergency Medicine | Admitting: Emergency Medicine

## 2012-09-16 ENCOUNTER — Emergency Department (HOSPITAL_COMMUNITY): Payer: PRIVATE HEALTH INSURANCE

## 2012-09-16 ENCOUNTER — Encounter (HOSPITAL_COMMUNITY): Payer: Self-pay | Admitting: Emergency Medicine

## 2012-09-16 DIAGNOSIS — R209 Unspecified disturbances of skin sensation: Secondary | ICD-10-CM | POA: Insufficient documentation

## 2012-09-16 DIAGNOSIS — Z79899 Other long term (current) drug therapy: Secondary | ICD-10-CM | POA: Insufficient documentation

## 2012-09-16 DIAGNOSIS — Z8719 Personal history of other diseases of the digestive system: Secondary | ICD-10-CM | POA: Insufficient documentation

## 2012-09-16 DIAGNOSIS — R51 Headache: Secondary | ICD-10-CM | POA: Insufficient documentation

## 2012-09-16 DIAGNOSIS — Z8619 Personal history of other infectious and parasitic diseases: Secondary | ICD-10-CM | POA: Insufficient documentation

## 2012-09-16 DIAGNOSIS — E785 Hyperlipidemia, unspecified: Secondary | ICD-10-CM | POA: Insufficient documentation

## 2012-09-16 DIAGNOSIS — G510 Bell's palsy: Secondary | ICD-10-CM

## 2012-09-16 LAB — BASIC METABOLIC PANEL
BUN: 10 mg/dL (ref 6–23)
CO2: 19 mEq/L (ref 19–32)
Calcium: 9.5 mg/dL (ref 8.4–10.5)
Chloride: 105 mEq/L (ref 96–112)
Creatinine, Ser: 0.68 mg/dL (ref 0.50–1.10)
GFR calc Af Amer: >90 mL/min (ref >90)
GFR calc non Af Amer: >90 mL/min (ref >90)
Glucose, Bld: 89 mg/dL (ref 70–99)
Potassium: 4 mEq/L (ref 3.5–5.1)
Sodium: 139 mEq/L (ref 135–145)

## 2012-09-16 LAB — CBC WITH DIFFERENTIAL/PLATELET
Basophils Absolute: 0 10*3/uL (ref 0.0–0.1)
Basophils Relative: 0 % (ref 0–1)
Eosinophils Absolute: 0.3 10*3/uL (ref 0.0–0.7)
Eosinophils Relative: 4 % (ref 0–5)
HCT: 37 % (ref 36.0–46.0)
Hemoglobin: 12.7 g/dL (ref 12.0–15.0)
Lymphocytes Relative: 23 % (ref 12–46)
Lymphs Abs: 1.8 10*3/uL (ref 0.7–4.0)
MCH: 31.5 pg (ref 26.0–34.0)
MCHC: 34.3 g/dL (ref 30.0–36.0)
MCV: 91.8 fL (ref 78.0–100.0)
Monocytes Absolute: 0.6 10*3/uL (ref 0.1–1.0)
Monocytes Relative: 7 % (ref 3–12)
Neutro Abs: 5.2 10*3/uL (ref 1.7–7.7)
Neutrophils Relative %: 66 % (ref 43–77)
Platelets: ADEQUATE 10*3/uL (ref 150–400)
RBC: 4.03 MIL/uL (ref 3.87–5.11)
RDW: 12.9 % (ref 11.5–15.5)
Smear Review: ADEQUATE
WBC: 7.9 10*3/uL (ref 4.0–10.5)

## 2012-09-16 MED ORDER — KETOROLAC TROMETHAMINE 30 MG/ML IJ SOLN
30.0000 mg | Freq: Once | INTRAMUSCULAR | Status: DC
  Filled 2012-09-16: qty 1

## 2012-09-16 MED ORDER — KETOROLAC TROMETHAMINE 60 MG/2ML IM SOLN
60.0000 mg | Freq: Once | INTRAMUSCULAR | Status: AC
  Administered 2012-09-16: 60 mg via INTRAMUSCULAR
  Filled 2012-09-16: qty 2

## 2012-09-16 MED ORDER — ACYCLOVIR 400 MG PO TABS: 800.0000 mg | ORAL_TABLET | Freq: Every day | ORAL | Status: DC

## 2012-09-16 MED ORDER — PREDNISONE 20 MG PO TABS: 40.0000 mg | ORAL_TABLET | Freq: Every day | ORAL | Status: DC

## 2012-09-16 NOTE — ED Provider Notes (Signed)
CSN: [id number]     Arrival date & time 09/16/12  1048 History     First MD Initiated Contact with Patient 09/16/12 1049     No chief complaint on file.  (Consider location/radiation/quality/duration/timing/severity/associated sxs/prior Treatment) HPI Comments: Patient is a 37 year old female with a past medical history of hyperlipidemia who presents with right side facial weakness and numbness. Patient reports waking up with these symptoms this morning. She reports trying to sip her coffee and it "dribbled" out of the right side of her mouth. The symptoms have been continuous since onset this morning. Patient reports having a severe left side headache yesterday evening. Patient also reports associated diminished taste on the right side of her tongue. She denies any other associated symptoms. No aggravating/alleviating factors. Patient denies tick bite. Patient states her father had a heart attack at age 47 and subsequent strokes.    Past Medical History  Diagnosis Date  . 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 Trichomonas     treated  . Oligohydramnios     with first pregnancy   . Hyperlipidemia   . Rectal fistula    Past Surgical History  Procedure Laterality Date  . Tubal ligation    . Hemorrhoid surgery  2003    with fistula surgery  . Wisdom tooth extraction  2004   Family History  Problem Relation Age of Onset  . Hypertension Father   . Heart disease Father     on meds   . Diabetes Father     oral meds  . Stomach cancer Maternal Grandmother   . Breast cancer Maternal Grandmother   . Diabetes Paternal Grandmother     insulin  . Stroke Paternal Grandfather    History  Substance Use Topics  . Smoking status: Never Smoker   . Smokeless tobacco: Never Used  . Alcohol Use: 0.5 oz/week    1 drink(s) per week   OB History   Grav Para Term Preterm Abortions TAB SAB Ect Mult Living   4 2  2  2  2   2      Review of Systems  Neurological: Positive for facial asymmetry, numbness and headaches.  All other systems reviewed and are negative.    Allergies  Review of patient's allergies indicates no known allergies.  Home Medications   Current Outpatient Rx  Name  Route  Sig  Dispense  Refill  . hydrocortisone (ANUSOL-HC) 25 MG suppository   Rectal   Place 1 suppository (25 mg total) rectally 2 (two) times daily as needed for hemorrhoids.   12 suppository   2   . Krill Oil CAPS   Oral   Take 1 capsule by mouth daily.         . Multiple Vitamin (MULTIVITAMIN) LIQD   Oral   Take 5 mLs by mouth daily.         . simvastatin (ZOCOR) 40 MG tablet   Oral   Take 40 mg by mouth at bedtime.          BP 133/81  Pulse 75  Temp(Src) 98 F (36.7 C) (Oral)  Resp 14  SpO2 100%  LMP 08/10/2012 Physical Exam  Nursing note and vitals reviewed. Constitutional: She is oriented to person, place, and time. She appears well-developed and well-nourished. No distress.  HENT:  Head: Normocephalic and  atraumatic.  Mouth/Throat: Oropharynx is clear and moist. No oropharyngeal exudate.  Eyes: Conjunctivae and EOM are normal. Pupils are equal, round, and reactive to light. No scleral icterus.  Neck: Normal range of motion.  Cardiovascular: Normal rate and regular rhythm.  Exam reveals no gallop and no friction rub.   No murmur heard. Pulmonary/Chest: Effort normal and breath sounds normal. She has no wheezes. She has no rales. She exhibits no tenderness.  Abdominal: Soft. She exhibits no distension. There is no tenderness. There is no rebound and no guarding.  Musculoskeletal: Normal range of motion.  Neurological: She is alert and oriented to person, place, and time. A cranial nerve deficit is present. Coordination normal.  Right facial sensation diminished with mild weakness of right facial muscles. Speech is goal-oriented. Moves limbs without ataxia. Extremity strength and  sensation equal and intact bilaterally.   Skin: Skin is warm and dry.  Psychiatric: She has a normal mood and affect. Her behavior is normal.    ED Course   Procedures (including critical care time)   Date: 09/16/2012  Rate: 68  Rhythm: normal sinus rhythm  QRS Axis: normal  Intervals: normal  ST/T Wave abnormalities: normal  Conduction Disutrbances:none  Narrative Interpretation: NSR without previous for comparison  Old EKG Reviewed: none available  Labs Reviewed  CBC WITH DIFFERENTIAL  BASIC METABOLIC PANEL   Ct Head Wo Contrast  09/16/2012   *RADIOLOGY REPORT*  Clinical Data: Severe left occipital headache.  Right facial numbness.  Facial droop.  CT HEAD WITHOUT CONTRAST  Technique:  Contiguous axial images were obtained from the base of the skull through the vertex without contrast.  Comparison: None.  Findings: The brain has a normal appearance without evidence of malformation, atrophy, old or acute infarction, mass lesion, hemorrhage, hydrocephalus or extra-axial collection.  The calvarium is unremarkable.  Sinuses, middle ears and mastoids are clear.  IMPRESSION: Normal head CT.   Original Report Authenticated By: Paulina Fusi, M.D.   1. Bell's palsy     MDM  12:10 PM Labs and CT head pending. Right facial numbness and weakness without other neuro deficits. Vitals stable and patient afebrile.   2:59 PM Labs unremarkable for acute changes. CT head unremarkable. Patient has forehead involvement of the deficit, indicating Bell's palsy over stroke or other ischemic process. Patient will be discharged with Valtrex and prednisone. Patient instructed to follow up with her PCP. No further evaluation needed at this time.     Emilia Beck, PA-C 09/17/12 1248

## 2012-09-16 NOTE — ED Notes (Signed)
Pt transported to radiology.

## 2012-09-16 NOTE — ED Notes (Signed)
Last seen normal: bedtime last evening. Had severe headache last night.  Woke up with jaw numbness and tingling, progressed to facial droop at work that was not there at beginning. No slurred speech, grips strong and equal and no neuro deficits.

## 2012-09-18 NOTE — ED Provider Notes (Signed)
Medical screening examination/treatment/procedure(s) were conducted as a shared visit with non-physician practitioner(s) and myself.  I personally evaluated the patient during the encounter Pt with right sided facial weakness including forehead. No headache. Exam c/w bells palsy.  Suzi Roots, MD 09/18/12 [phone]

## 2012-09-28 ENCOUNTER — Telehealth: Payer: Self-pay | Admitting: Gastroenterology

## 2012-09-28 NOTE — Telephone Encounter (Signed)
If she cannot wait for another 4-6 weeks, then, we can schedule her at the hospital via sigmoidoscopy

## 2012-09-28 NOTE — Telephone Encounter (Signed)
Pt aware.

## 2012-09-28 NOTE — Telephone Encounter (Signed)
Pt has called requesting an update on where she stands as far as getting her hemorrhoids banded in the office. Please advise. Per OV note it states her name has been placed on the list.

## 2012-10-12 ENCOUNTER — Encounter: Payer: Self-pay | Admitting: Neurology

## 2012-10-14 ENCOUNTER — Encounter: Payer: Self-pay | Admitting: Neurology

## 2012-10-14 ENCOUNTER — Ambulatory Visit (INDEPENDENT_AMBULATORY_CARE_PROVIDER_SITE_OTHER): Payer: No Typology Code available for payment source | Admitting: Neurology

## 2012-10-14 VITALS — BP 114/73 | HR 73 | Temp 97.6°F | Resp 16 | Ht 69.0 in | Wt 198.0 lb

## 2012-10-14 DIAGNOSIS — G510 Bell's palsy: Secondary | ICD-10-CM | POA: Insufficient documentation

## 2012-10-14 NOTE — Patient Instructions (Addendum)
Bell's Palsy  Bell's palsy is a condition in which the muscles on one side of the face cannot move (paralysis). This is because the nerves in the face are paralyzed. It is most often thought to be caused by a virus. The virus causes swelling of the nerve that controls movement on one side of the face. The nerve travels through a tight space surrounded by bone. When the nerve swells, it can be compressed by the bone. This results in damage to the protective covering around the nerve. This damage interferes with how the nerve communicates with the muscles of the face. As a result, it can cause weakness or paralysis of the facial muscles.   Injury (trauma), tumor, and surgery may cause Bell's palsy, but most of the time the cause is unknown. It is a relatively common condition. It starts suddenly (abrupt onset) with the paralysis usually ending within 2 days. Bell's palsy is not dangerous. But because the eye does not close properly, you may need care to keep the eye from getting dry. This can include splinting (to keep the eye shut) or moistening with artificial tears. Bell's palsy very seldom occurs on both sides of the face at the same time.  SYMPTOMS    Eyebrow sagging.   Drooping of the eyelid and corner of the mouth.   Inability to close one eye.   Loss of taste on the front of the tongue.   Sensitivity to loud noises.  TREATMENT   The treatment is usually non-surgical. If the patient is seen within the first 24 to 48 hours, a short course of steroids may be prescribed, in an attempt to shorten the length of the condition. Antiviral medicines may also be used with the steroids, but it is unclear if they are helpful.   You will need to protect your eye, if you cannot close it. The cornea (clear covering over your eye) will become dry and can be damaged. Artificial tears can be used to keep your eye moist. Glasses or an eye patch should be worn to protect your eye.  PROGNOSIS   Recovery is variable, ranging  from days to months. Although the problem usually goes away completely (about 80% of cases resolve), predicting the outcome is impossible. Most people improve within 3 weeks of when the symptoms began. Improvement may continue for 3 to 6 months. A small number of people have moderate to severe weakness that is permanent.   HOME CARE INSTRUCTIONS    If your caregiver prescribed medication to reduce swelling in the nerve, use as directed. Do not stop taking the medication unless directed by your caregiver.   Use moisturizing eye drops as needed to prevent drying of your eye, as directed by your caregiver.   Protect your eye, as directed by your caregiver.   Use facial massage and exercises, as directed by your caregiver.   Perform your normal activities, and get your normal rest.  SEEK IMMEDIATE MEDICAL CARE IF:    There is pain, redness or irritation in the eye.   You or your child has an oral temperature above 102 F (38.9 C), not controlled by medicine.  MAKE SURE YOU:    Understand these instructions.   Will watch your condition.   Will get help right away if you are not doing well or get worse.  Document Released: 02/04/2005 Document Revised: 04/29/2011 Document Reviewed: 02/13/2009  [employer] Patient Information 2014 [employer].

## 2012-10-14 NOTE — Progress Notes (Signed)
[facility]  Provider:  Melvyn Novas, M D  Referring Provider: Hal Morales, MD Primary Care Physician:  Hal Morales, MD  Chief Complaint  Patient presents with  . New Evaluation    Bell's Palsy Dr. Renne Crigler paper referral rm [id number]    HPI:  Veronica Villanueva is a 37 y.o. female  Is seen here as a referral/ revisit  from Dr. Pennie Rushing for a right facial Bell's palsy.  Mrs. Kirkendoll is a young, right-handed [place] American female, who noticed on July 30 a change in the sensation of her right face. She went as she usually does to he office ,  And co- workers noted the facial droop in the  morning . The droop which involves the upper  And lower eyelid of the right eye, forehead and lower face on the right,  in short all 3 branches of her facial innervation.  In retrospect, the patient recalls that on 27th of July ( 2.5 days prior to the droop)  she developed headaches on the back- center nape of her neck, possibly  starting behind her right ear. This pain or pressure  decreased but did not resolve . Gradually ,  she has noticed a change in her gustation , her taste was "numbed' . Her hearing changed, she bacame hyperacoustic, and had ear pain on the right .  The patient had initially ( in early August ) drooled and started to wear an eye patch, than swimming glasses for protection of the right eye's cornea.  After sleeping she feels still  that her neck stiffness was worse, and heat makes symptoms  better. A CT scan in the emergency room short no abnormality,  but so far no  MRI yet been done. She took ibuprofen  (which helped to ease the discomfort,) that she describes as a sharp tension-like neck pain. She has no history of migraines.  When the patient was seen  by the local ED , where she was prescribed acyclovir. Then her primary care physician, Dr. Renne Crigler  gave her a prednisone Dose pack, on 09/24/2012.  On 10/08/2012 seen in  the urgent care and was again given  methylprednisolone. She has seen a chiropractor as well, who informed her that she had a pinched nerve in the back and started electric stimulation to the right face. All this within the first 3-4 weeks after Bells's palsy onset.   The patient feels a little better but her facial droop is still very evident. The drooling stopped and the facial numbness or puffiness sensation has improved. She still needs artificial tears /eye protection at night.   The patient reports no history of autoimmune disorders. No history of family autoimmune disorders, such as LUPUS, Crohn's disease, multiple sclerosis, Sjogren's disease,  Granulomatosis .     Review of Systems: Out of a complete 14 system review, the patient complains of only the following symptoms, and all other reviewed systems are negative. Facial ear pain on the right, hyperacusis, neck stiffness. Soreness has been relieved.  Gustatory changes . No weight gain, no sleep habits changed, lymphnode swelling in neck and mandibular was reported. No fever, no nausea and no GI problems.   History   Social History  . Marital Status: Married    Spouse Name: N/A    Number of Children: 2  . Years of Education: HS   Occupational History  . medical billing    Social History Main Topics  . Smoking status: Never Smoker   .  Smokeless tobacco: Never Used  . Alcohol Use: 0.5 oz/week    1 drink(s) per week     Comment: one drink per week wine  . Drug Use: No  . Sexual Activity: Yes    Birth Control/ Protection: Surgical     Comment: BTL   Other Topics Concern  . Not on file   Social History Narrative   Patient consumes 1 cup of caffeine daily coffee/tea.    Family History  Problem Relation Age of Onset  . Hypertension Father   . Heart disease Father     on meds   . Diabetes Father     oral meds  . Hyperlipidemia Father   . Stomach cancer Maternal Grandmother   . Breast cancer Maternal Grandmother   . Diabetes Paternal Grandmother      insulin  . Stroke Paternal Grandfather   . Diabetes Paternal Grandfather   . Hypertension Mother     Past Medical History  Diagnosis Date  . 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 Trichomonas     treated  . Oligohydramnios     with first pregnancy   . Hyperlipidemia   . Rectal fistula   . Pre-diabetes     Past Surgical History  Procedure Laterality Date  . Tubal ligation    . Hemorrhoid surgery  2003    with fistula surgery  . Wisdom tooth extraction  2004    Current Outpatient Prescriptions  Medication Sig Dispense Refill  . acyclovir (ZOVIRAX) 400 MG tablet Take 2 tablets (800 mg total) by mouth 5 (five) times daily.  50 tablet  0  . hydrocortisone (ANUSOL-HC) 25 MG suppository Place 1 suppository (25 mg total) rectally 2 (two) times daily as needed for hemorrhoids.  12 suppository  2  . Krill Oil CAPS Take 1 capsule by mouth daily.      . Multiple Vitamin (MULTIVITAMIN) LIQD Take 5 mLs by mouth daily.      . predniSONE (DELTASONE) 20 MG tablet Take 2 tablets (40 mg total) by mouth daily.  10 tablet  0  . simvastatin (ZOCOR) 40 MG tablet Take 40 mg by mouth at bedtime.       No current facility-administered medications for this visit.    Allergies as of 10/14/2012  . (No Known Allergies)    Vitals: BP 114/73  Pulse 73  Temp(Src) 97.6 F (36.4 C)  Resp 16  Ht 5\' 9"  (1.753 m)  Wt 198 lb (89.812 kg)  BMI 29.23 kg/m2 Last Weight:  Wt Readings from Last 1 Encounters:  10/14/12 198 lb (89.812 kg)   Last Height:   Ht Readings from Last 1 Encounters:  10/14/12 5\' 9"  (1.753 m)    Physical exam:  General: The patient is awake, alert and appears not in acute distress. The patient is well groomed. Head: Normocephalic, atraumatic. Neck is supple. Mallampati 3, neck circumference: 14 inches, no nasal septal deviation. No retrognathia. Cardiovascular:  Regular rate and rhythm, without   murmurs or carotid bruit, and without distended neck veins. Respiratory: Lungs are clear to auscultation. Skin:  Without evidence of edema, or rash. The patient's neck-stiffness is still palpable over Cervical paraspinal area,  she has a swelling at the mandibular lymph nodes and her neck is hot to the touch. Trunk: BMI is elevated ,  This  patient  has normal posture. No shoulder droop.   Neurologic exam : The patient is awake and alert, oriented to place and time.  Memory subjective  described as intact. There is a normal attention span & concentration ability. Speech is fluent without dysarthria, dysphonia or aphasia.  Mood and affect are appropriate, the patient appears concerned. .  Cranial nerves: Pupils are equal and briskly reactive to light. Funduscopic exam without  evidence of pallor or edema. Extraocular movements  in vertical and horizontal planes intact and without nystagmus.  She has still  trouble completely closing the right eye, corneal  Irritation. Visual fields by finger perimetry are intact. Hearing to finger rub intact. Rinne weber non lateralizing.  Facial sensation intact to fine touch- no loss of facial sensation .  Facial motor strength is asymmetric, with the right face being droopy- involving forehead and midface and lower face. I did not test gustation.  .  She drooled  Initially, not over the last week ( improvement?)  and tongue and uvula move midline.  Motor exam:   Normal tone and normal muscle bulk and symmetric normal strength in all extremities.  Sensory:  Fine touch, pinprick and vibration were tested in all extremities. Proprioception is normal.  Coordination: Rapid alternating movements in the fingers/hands is tested and normal. Finger-to-nose maneuver tested and normal without evidence of ataxia, dysmetria or tremor.  Gait and station: Patient walks without assistive device Steps are unfragmented. Romberg testing isnormal.  Deep tendon reflexes: in the   upper and lower extremities are symmetric and intact. Babinski maneuver response is  downgoing.   Assessment:  After physical and neurologic examination, review of imaging,  assessment is as follows: left sided Bell's palsy. Patient has reported anterior neck lymph node swelling , there is still a swelling palpable.  Bells palsy can be ideopathic, but may be result of a  viral infection, herpetic virus family.  Her CBC and diff were not abnormal the day of onset of the Bells palsy but could have evolved.  She is is pre- diabetic , vascular risk factors are present.   She is young, female and  black and  needs testing for sarcoid, lupus, sjoegrens  and needs an MRI brain to rule out demyelination or small vascular insult to the facial nerve.   Plan:  Treatment plan and additional workup : MRI brain , attention to the Facial nerve and nucleus.  Labs for CBC diff, and sarcoid panel, sjoegrens,  an  ANA to be  drawn . Prednisone dose pack is finished in 2 days.  Facial exercises would be OK , but I was not impressed with electric facial muscle stimulation early in recovery.   Patient will finish her antiviral medication as prescribed. She is already using eye protection ointment and artificial tears. Inflammation about the causes of Bells palsy was given, and an additional  patient information brochure printed. Discussion and answering the patients questions was 15 minutes of the total visit time.

## 2012-10-15 LAB — C-REACTIVE PROTEIN: CRP: 0.6 mg/L (ref 0.0–4.9)

## 2012-10-15 LAB — ANA W/REFLEX IF POSITIVE: Anti Nuclear Antibody(ANA): NEGATIVE

## 2012-10-15 LAB — ANGIOTENSIN CONVERTING ENZYME: Angio Convert Enzyme: 41 U/L (ref 14–82)

## 2012-10-15 LAB — SJOGRENS SYNDROME-A EXTRACTABLE NUCLEAR ANTIBODY: ENA SSA (RO) Ab: <0.2 AI (ref 0.0–0.9)

## 2012-10-15 LAB — SJOGRENS SYNDROME-B EXTRACTABLE NUCLEAR ANTIBODY: ENA SSB (LA) Ab: <0.2 AI (ref 0.0–0.9)

## 2012-10-20 ENCOUNTER — Encounter: Payer: Self-pay | Admitting: Neurology

## 2012-10-22 ENCOUNTER — Telehealth: Payer: Self-pay | Admitting: Neurology

## 2012-10-22 NOTE — Telephone Encounter (Signed)
Left message for patient at [phone], that labs were normal, per Dr. Vickey Huger.

## 2012-10-26 ENCOUNTER — Telehealth: Payer: Self-pay | Admitting: Neurology

## 2012-10-26 DIAGNOSIS — G510 Bell's palsy: Secondary | ICD-10-CM

## 2012-10-27 NOTE — Telephone Encounter (Signed)
Spoke to patient. Says she visited Dr. Jenne Pane (ENT) yesterday and he advised her the pain she is experiencing is probably related to recovery process. I advised patient not showing any earlier appts w NP-CM but she is on wait list. Patient requesting to know if there is anything else she can do in the meantime of appt to help ease pain.

## 2012-10-27 NOTE — Telephone Encounter (Signed)
I also left message for patient that Dr. Vickey Huger is waiting for results of MRI brain to see if there are any other problems that may be causing pain.

## 2012-10-29 ENCOUNTER — Telehealth: Payer: Self-pay | Admitting: Neurology

## 2012-10-29 NOTE — Telephone Encounter (Signed)
Patient has a small knot on the base of her head and wanted to know if the MRI would show if it's a tumor or another problem. I explained how and MRI works and that it should pick it up but, do let the tech know so they make sure to take an image in that area as well. Patient asked what an MRI was, how it was performed, how they got the dye in. I discussed these with her.

## 2012-10-30 ENCOUNTER — Ambulatory Visit
Admission: RE | Admit: 2012-10-30 | Discharge: 2012-10-30 | Disposition: A | Discharge status: Home / Self Care | Payer: 59 | Source: Ambulatory Visit | Attending: Neurology | Admitting: Neurology

## 2012-10-30 ENCOUNTER — Other Ambulatory Visit: Payer: Self-pay | Admitting: Neurology

## 2012-10-30 DIAGNOSIS — G510 Bell's palsy: Secondary | ICD-10-CM

## 2012-11-01 ENCOUNTER — Other Ambulatory Visit: Payer: PRIVATE HEALTH INSURANCE

## 2012-11-02 ENCOUNTER — Telehealth: Payer: Self-pay | Admitting: Gastroenterology

## 2012-11-02 ENCOUNTER — Telehealth: Payer: Self-pay | Admitting: Neurology

## 2012-11-02 ENCOUNTER — Other Ambulatory Visit: Payer: Self-pay | Admitting: Neurology

## 2012-11-02 DIAGNOSIS — G510 Bell's palsy: Secondary | ICD-10-CM

## 2012-11-02 NOTE — Telephone Encounter (Signed)
Ill check but im pretty sure she is

## 2012-11-02 NOTE — Telephone Encounter (Signed)
Let pt know that Dr. Arlyce Dice will start doing these procedures in the office as of 11/19/12 and that she should be receiving a call soon to get her scheduled.

## 2012-11-03 ENCOUNTER — Telehealth: Payer: Self-pay | Admitting: *Deleted

## 2012-11-03 NOTE — Telephone Encounter (Signed)
She is on my list Veronica Villanueva

## 2012-11-03 NOTE — Telephone Encounter (Signed)
LM

## 2012-11-03 NOTE — Telephone Encounter (Signed)
PATIENT SCHEDULED FOR HEMOORHOIDAL BANDING ON 10/2

## 2012-11-04 ENCOUNTER — Ambulatory Visit
Admission: RE | Admit: 2012-11-04 | Discharge: 2012-11-04 | Disposition: A | Discharge status: Home / Self Care | Payer: 59 | Source: Ambulatory Visit | Attending: Neurology | Admitting: Neurology

## 2012-11-04 DIAGNOSIS — G510 Bell's palsy: Secondary | ICD-10-CM

## 2012-11-04 MED ORDER — GADOBENATE DIMEGLUMINE 529 MG/ML IV SOLN
19.0000 mL | Freq: Once | INTRAVENOUS | Status: AC | PRN
  Administered 2012-11-04: 19 mL via INTRAVENOUS

## 2012-11-09 ENCOUNTER — Telehealth: Payer: Self-pay | Admitting: Neurology

## 2012-11-09 MED ORDER — CARBAMAZEPINE 100 MG PO CHEW: 100.0000 mg | CHEWABLE_TABLET | Freq: Two times a day (BID) | ORAL | Status: DC

## 2012-11-09 NOTE — Telephone Encounter (Signed)
Patient with bells palsy and pain, MRI results normal.  I am glad the ENT-MD was able to give her good news.   No more prednisone,  I will order carbamazepine. 100 mg bid po - printed. Please tell patient to take it about 12 hours apart.

## 2012-11-10 ENCOUNTER — Telehealth: Payer: Self-pay | Admitting: Neurology

## 2012-11-10 NOTE — Telephone Encounter (Signed)
I called patient and let her know the results of her second MRI in the past 3 months and one CT in the same time. There is nothing of significance that the doctor is seeing at this time. Please call with any questions.

## 2012-11-10 NOTE — Telephone Encounter (Signed)
I called pt work and LMVM for her that MRI results normal, generic tegretrol 100mg  po bid for neuralgia.  Pt returned call I gave her the results of MRI, she had questions about the haziness, I told her after speaking with Dr. Marjory Lies that nonspecific gliosis is what he stated and per Dr. Vickey Huger normal MRI.  When in for appt 12-07-12, may go over this in more detail, no other testing ordered.  She verbalized understanding.

## 2012-11-11 ENCOUNTER — Telehealth: Payer: Self-pay | Admitting: Neurology

## 2012-11-11 NOTE — Telephone Encounter (Signed)
I called patient back again and left another message to call back if needed.

## 2012-11-19 ENCOUNTER — Ambulatory Visit: Payer: 59 | Admitting: Gastroenterology

## 2012-11-19 VITALS — BP 110/68 | HR 76 | Ht 68.0 in | Wt 198.1 lb

## 2012-11-19 DIAGNOSIS — K648 Other hemorrhoids: Secondary | ICD-10-CM

## 2012-11-19 NOTE — Patient Instructions (Addendum)
2nd hemorrhoidal banding scheduled on 12/18/2012 at 8:45am 3rd Hemorrhoidal banding scheduled on 01/06/2013 at 3pm  HEMORRHOID BANDING PROCEDURE    FOLLOW-UP CARE   1. The procedure you have had should have been relatively painless since the banding of the area involved does not have nerve endings and there is no pain sensation.  The rubber band cuts off the blood supply to the hemorrhoid and the band may fall off as soon as 48 hours after the banding (the band may occasionally be seen in the toilet bowl following a bowel movement). You may notice a temporary feeling of fullness in the rectum which should respond adequately to plain Tylenol or Motrin.  2. Following the banding, avoid strenuous exercise that evening and resume full activity the next day.  A sitz bath (soaking in a warm tub) or bidet is soothing, and can be useful for cleansing the area after bowel movements.     3. To avoid constipation, take two tablespoons of natural wheat bran, natural oat bran, flax, Benefiber or any over the counter fiber supplement and increase your water intake to 7-8 glasses daily.    4. Unless you have been prescribed anorectal medication, do not put anything inside your rectum for two weeks: No suppositories, enemas, fingers, etc.  5. Occasionally, you may have more bleeding than usual after the banding procedure.  This is often from the untreated hemorrhoids rather than the treated one.  Don't be concerned if there is a tablespoon or so of blood.  If there is more blood than this, lie flat with your bottom higher than your head and apply an ice pack to the area. If the bleeding does not stop within a half an hour or if you feel faint, call our office at [phone] or go to the emergency room.  6. Problems are not common; however, if there is a substantial amount of bleeding, severe pain, chills, fever or difficulty passing urine (very rare) or other problems, you should call us at [phone]  or report to the nearest emergency room.  7. Do not stay seated continuously for more than 2-3 hours for a day or two after the procedure.  Tighten your buttock muscles 10-15 times every two hours and take 10-15 deep breaths every 1-2 hours.  Do not spend more than a few minutes on the toilet if you cannot empty your bowel; instead re-visit the toilet at a later time.

## 2012-11-19 NOTE — Progress Notes (Signed)
PROCEDURE NOTE: The patient presents with symptomatic grade **1*  hemorrhoids, requesting rubber band ligation of his/her hemorrhoidal disease.  All risks, benefits and alternative forms of therapy were described and informed consent was obtained.  In the Left Lateral Decubitus position anoscopic examination revealed grade *1** hemorrhoids in the *right posterior and right anterior** position(s).  The anorectum was pre-medicated with *lubricant and nitroglycerin ointment** The decision was made to band the *right anterior** internal hemorrhoid, and the [facility] O'Regan System was used to perform band ligation without complication.  Digital anorectal examination was then performed to assure proper positioning of the band, and to adjust the banded tissue as required.  The patient was discharged home without pain or other issues.  Dietary and behavioral recommendations were given and along with follow-up instructions.     The following adjunctive treatments were recommended:  *Fiber supplementation**  The patient will return in *2-3 weeks** for  follow-up and possible additional banding as required. No complications were encountered and the patient tolerated the procedure well.

## 2012-12-07 ENCOUNTER — Ambulatory Visit (INDEPENDENT_AMBULATORY_CARE_PROVIDER_SITE_OTHER): Payer: No Typology Code available for payment source | Admitting: Nurse Practitioner

## 2012-12-07 ENCOUNTER — Encounter: Payer: Self-pay | Admitting: Nurse Practitioner

## 2012-12-07 VITALS — BP 114/73 | HR 75 | Temp 97.7°F | Ht 69.0 in | Wt 198.0 lb

## 2012-12-07 DIAGNOSIS — G510 Bell's palsy: Secondary | ICD-10-CM

## 2012-12-07 NOTE — Progress Notes (Signed)
[facility]  PATIENT: Veronica Villanueva DOB: 1976/01/18   REASON FOR VISIT: follow up HISTORY FROM: patient  HISTORY OF PRESENT ILLNESS: Veronica Villanueva is a 37 y.o. female Is seen here as a referral/ revisit from Dr. Pennie Rushing for a right facial Bell's palsy.  Veronica Villanueva is a young, right-handed [place] American female, who noticed on July 30 a change in the sensation of her right face. She went as she usually does to he office , And co- workers noted the facial droop in the morning . The droop which involves the upper And lower eyelid of the right eye, forehead and lower face on the right, in short all 3 branches of her facial innervation.  In retrospect, the patient recalls that on 27th of July ( 2.5 days prior to the droop) she developed headaches on the back- center nape of her neck, possibly starting behind her right ear. This pain or pressure decreased but did not resolve . Gradually , she has noticed a change in her gustation , her taste was "numbed' . Her hearing changed, she bacame hyperacoustic, and had ear pain on the right .  The patient had initially ( in early August ) drooled and started to wear an eye patch, than swimming glasses for protection of the right eye's cornea. After sleeping she feels still that her neck stiffness was worse, and heat makes symptoms better. A CT scan in the emergency room short no abnormality, but so far no MRI yet been done.  She took ibuprofen (which helped to ease the discomfort,) that she describes as a sharp tension-like neck pain. She has no history of migraines.  When the patient was seen by the local ED , where she was prescribed acyclovir. Then her primary care physician, Dr. Renne Crigler gave her a prednisone Dose pack, on 09/24/2012.  On 10/08/2012 seen in the urgent care and was again given methylprednisolone. She has seen a chiropractor as well, who informed her that she had a pinched nerve in the back and started electric  stimulation to the right face.  All this within the first 3-4 weeks after Bells's palsy onset.  The patient feels a little better but her facial droop is still very evident. The drooling stopped and the facial numbness or puffiness sensation has improved. She still needs artificial tears /eye protection at night.  The patient reports no history of autoimmune disorders. No history of family autoimmune disorders, such as LUPUS, Crohn's disease, multiple sclerosis, Sjogren's disease, Granulomatosis.   UPDATE 12/07/12 (LL):  Veronica Villanueva returns for revisit, to get test results.  Her MRI and auto-immune panel labs were unremarkable.  She had completed a 7 day course of Acyclovir and 2 courses of Prednisone after diagnosis.  She reports that her hearing and taste are back to normal, but she still has moderate right facial droop and paresthesias on the right side of her head which are mild.  She is concerned that the droop has not gone away and wants to hear that it will eventually.  She can almost close the right eye, but not fully.  She still has possible cyst/lymph node/soft tissue swelling at the occipital area, near hairline, painless, unchanged.  Review of Systems:  Out of a complete 14 system review, the patient complains of only the following symptoms, and all other reviewed systems are negative.  Facial ear pain on the right, hyperacusis, neck stiffness have resolved. Soreness has been relieved. Gustatory changes resolved. No weight gain,  no sleep habits changed, lymphnode swelling in neck and mandibular was reported. No fever, no nausea and no GI problems.   ALLERGIES: No Known Allergies  HOME MEDICATIONS: Outpatient Prescriptions Prior to Visit  Medication Sig Dispense Refill  . Krill Oil CAPS Take 1 capsule by mouth daily.      . Multiple Vitamin (MULTIVITAMIN) LIQD Take 5 mLs by mouth daily.      . simvastatin (ZOCOR) 40 MG tablet Take 40 mg by mouth at bedtime.      [name] acyclovir (ZOVIRAX)  400 MG tablet Take 2 tablets (800 mg total) by mouth 5 (five) times daily.  50 tablet  0   No facility-administered medications prior to visit.    PAST MEDICAL HISTORY: Past Medical History  Diagnosis Date  . 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 Trichomonas     treated  . Oligohydramnios     with first pregnancy   . Hyperlipidemia   . Rectal fistula   . Pre-diabetes     PAST SURGICAL HISTORY: Past Surgical History  Procedure Laterality Date  . Tubal ligation    . Hemorrhoid surgery  2003    with fistula surgery  . Wisdom tooth extraction  2004    FAMILY HISTORY: Family History  Problem Relation Age of Onset  . Hypertension Father   . Heart disease Father     on meds   . Diabetes Father     oral meds  . Hyperlipidemia Father   . Stomach cancer Maternal Grandmother   . Breast cancer Maternal Grandmother   . Diabetes Paternal Grandmother     insulin  . Stroke Paternal Grandfather   . Diabetes Paternal Grandfather   . Hypertension Mother     SOCIAL HISTORY: History   Social History  . Marital Status: Married    Spouse Name: Rylo    Number of Children: 2  . Years of Education: HS   Occupational History  . medical billing    Social History Main Topics  . Smoking status: Never Smoker   . Smokeless tobacco: Never Used  . Alcohol Use: 0.5 oz/week    1 drink(s) per week     Comment: one drink per week wine  . Drug Use: No  . Sexual Activity: Yes    Birth Control/ Protection: Surgical     Comment: BTL   Other Topics Concern  . Not on file   Social History Narrative   Patient lives at home with family.   Patient consumes 1 cup of caffeine daily coffee/tea.     PHYSICAL EXAM  Filed Vitals:   12/07/12 1040  BP: 114/73  Pulse: 75  Temp: 97.7 F (36.5 C)  TempSrc: Oral  Height: 5\' 9"  (1.753 m)  Weight: 198 lb (89.812 kg)   Body mass index is 29.23  kg/(m^2).  Physical exam:  General: The patient is awake, alert and appears not in acute distress. The patient is well groomed.  Head: Normocephalic, atraumatic.  Cardiovascular: Regular rate and rhythm, without murmurs or carotid bruit, and without distended neck veins.  Respiratory: Lungs are clear to auscultation.  Skin: Without evidence of edema, or rash. The patient's neck-stiffness is still palpable over Cervical paraspinal area, she has a swelling at the mandibular lymph nodes and her neck is hot to the touch.  Trunk: BMI is elevated ,  This patient has normal posture. No shoulder droop.   Neurologic exam :  The patient is awake and alert, oriented to place and time. Memory subjective described as intact. There is a normal attention span & concentration ability. Speech is fluent without dysarthria, dysphonia or aphasia.  Mood and affect are appropriate, the patient appears concerned.  Cranial nerves:  Pupils are equal and briskly reactive to light. Funduscopic exam without evidence of pallor or edema. Extraocular movements in vertical and horizontal planes intact and without nystagmus. She has still trouble completely closing the right eye, corneal Irritation. Visual fields by finger perimetry are intact.  Hearing to finger rub intact.  Facial sensation intact to fine touch- no loss of facial sensation .  Facial motor strength is asymmetric, with the right face being droopy- involving forehead and midface and lower face. I did not test gustation. She drooled Initially, has resolved, and tongue and uvula move midline.  Motor exam: Normal tone and normal muscle bulk and symmetric normal strength in all extremities.  Sensory: Fine touch, pinprick were normal in all extremities.   Coordination: Rapid alternating movements in the fingers/hands is tested and normal. Finger-to-nose maneuver tested and normal without evidence of ataxia, dysmetria or tremor.  Gait and station: Patient walks  without assistive device.  Romberg testing isnormal.  Deep tendon reflexes: in the upper and lower extremities are symmetric and intact.   DIAGNOSTIC DATA (LABS, IMAGING, TESTING) - I reviewed patient records, labs, notes, testing and imaging myself where available.  Lab Results  Component Value Date   WBC 7.9 09/16/2012   HGB 12.7 09/16/2012   HCT 37.0 09/16/2012   MCV 91.8 09/16/2012   PLT PLATELET CLUMPS NOTED ON SMEAR, COUNT APPEARS ADEQUATE 09/16/2012      Component Value Date/Time   NA 139 09/16/2012 1324   K 4.0 09/16/2012 1324   CL 105 09/16/2012 1324   CO2 19 09/16/2012 1324   GLUCOSE 89 09/16/2012 1324   GLUCOSE 91 01/13/2006 1004   BUN 10 09/16/2012 1324   CREATININE 0.68 09/16/2012 1324   CALCIUM 9.5 09/16/2012 1324   PROT 6.6 06/09/2006 1513   ALBUMIN 3.4* 06/09/2006 1513   AST 16 06/09/2006 1513   ALT 13 06/09/2006 1513   ALKPHOS 43 06/09/2006 1513   BILITOT 0.7 06/09/2006 1513   GFRNONAA >90 09/16/2012 1324   GFRAA >90 09/16/2012 1324   Lab Results  Component Value Date   CHOL 220* 06/09/2006   HDL 54.7 06/09/2006   LDLDIRECT 149.0 06/09/2006   TRIG 92 06/09/2006   CHOLHDL 4.0 CALC 06/09/2006   No results found for this basename: HGBA1C   No results found for this basename: VITAMINB12   Lab Results  Component Value Date   TSH 1.70 06/09/2006    10/30/12 (could not obtain IV access), 11/04/12 (returned for post-contrast views) Equivocal MRI brain and IAC (with and without) demonstrating:  There is hazy left greater than right parietal, peri-atrial T2 hyperintensities. No abnormal lesions are seen on post contrast views.    ASSESSMENT AND PLAN After physical and neurologic examination, review of imaging, assessment is as follows: right sided Bell's palsy.  Patient has reported anterior neck lymph node swelling , there is still a swelling palpable. Bells palsy can be ideopathic, but may be result of a viral infection, herpetic virus family.   She is is pre- diabetic, vascular  risk factors are present.  Testing for sarcoid, lupus, sjoegrens and MRI brain were unremarkable. Right facial droop is still  present after 3 months; I let patient know that in 7% of cases, may be permanent.  Plan:  Facial exercises would be OK, continue with electric facial muscle stimulation if able.  Recommended facial exercises and warm compresses prn for paresthesias, eye patch at night to protect eye. Continue lubrication to right eye prn. Follow up as needed.  Ronal Fear, MSN, NP-C 12/07/2012, 12:26 PM [facility] [address] [place], [place] [address] [phone]

## 2012-12-07 NOTE — Patient Instructions (Signed)
Continue facial massage and facial exercises, warm compresses to right side of face may be soothing.  May use an eyepatch at night to protect eye.  Follow up as needed.

## 2012-12-11 ENCOUNTER — Encounter: Payer: Self-pay | Admitting: Gastroenterology

## 2012-12-14 ENCOUNTER — Telehealth: Payer: Self-pay | Admitting: Gastroenterology

## 2012-12-14 NOTE — Telephone Encounter (Signed)
Pt states she had a message from someone about rescheduling her appt for banding on Thursday of last week. Please call pt, I did not see a note in epic.

## 2012-12-15 NOTE — Telephone Encounter (Signed)
Spoke with pt. Her 2nd hemorrhoidal banding is scheduled on 11/19 it was rescheduled due to Dr Arlyce Dice being out of the office

## 2012-12-18 ENCOUNTER — Encounter: Payer: 59 | Admitting: Gastroenterology

## 2013-01-06 ENCOUNTER — Ambulatory Visit (INDEPENDENT_AMBULATORY_CARE_PROVIDER_SITE_OTHER): Payer: 59 | Admitting: Gastroenterology

## 2013-01-06 VITALS — BP 120/70 | HR 66 | Ht 68.0 in | Wt 197.2 lb

## 2013-01-06 DIAGNOSIS — K649 Unspecified hemorrhoids: Secondary | ICD-10-CM

## 2013-01-06 NOTE — Progress Notes (Signed)
PROCEDURE NOTE: The patient presents with symptomatic grade *2**  hemorrhoids, requesting rubber band ligation of his/her hemorrhoidal disease.  All risks, benefits and alternative forms of therapy were described and informed consent was obtained.   The anorectum was pre-medicated with lubricant and nitroglycerine ointment The decision was made to band the *right posterior** internal hemorrhoid, and the [facility] O'Regan System was used to perform band ligation without complication.  Digital anorectal examination was then performed to assure proper positioning of the band, and to adjust the banded tissue as required.  The patient was discharged home without pain or other issues.  Dietary and behavioral recommendations were given and along with follow-up instructions.    The patient will return in **2* for  follow-up and possible additional banding as required. No complications were encountered and the patient tolerated the procedure well.   

## 2013-01-06 NOTE — Patient Instructions (Addendum)
HEMORRHOID BANDING PROCEDURE    FOLLOW-UP CARE   1. The procedure you have had should have been relatively painless since the banding of the area involved does not have nerve endings and there is no pain sensation.  The rubber band cuts off the blood supply to the hemorrhoid and the band may fall off as soon as 48 hours after the banding (the band may occasionally be seen in the toilet bowl following a bowel movement). You may notice a temporary feeling of fullness in the rectum which should respond adequately to plain Tylenol or Motrin.  2. Following the banding, avoid strenuous exercise that evening and resume full activity the next day.  A sitz bath (soaking in a warm tub) or bidet is soothing, and can be useful for cleansing the area after bowel movements.     3. To avoid constipation, take two tablespoons of natural wheat bran, natural oat bran, flax, Benefiber or any over the counter fiber supplement and increase your water intake to 7-8 glasses daily.    4. Unless you have been prescribed anorectal medication, do not put anything inside your rectum for two weeks: No suppositories, enemas, fingers, etc.  5. Occasionally, you may have more bleeding than usual after the banding procedure.  This is often from the untreated hemorrhoids rather than the treated one.  Don't be concerned if there is a tablespoon or so of blood.  If there is more blood than this, lie flat with your bottom higher than your head and apply an ice pack to the area. If the bleeding does not stop within a half an hour or if you feel faint, call our office at [phone] or go to the emergency room.  6. Problems are not common; however, if there is a substantial amount of bleeding, severe pain, chills, fever or difficulty passing urine (very rare) or other problems, you should call us at [phone] or report to the nearest emergency room.  7. Do not stay seated continuously for more than 2-3 hours for a day or two  after the procedure.  Tighten your buttock muscles 10-15 times every two hours and take 10-15 deep breaths every 1-2 hours.  Do not spend more than a few minutes on the toilet if you cannot empty your bowel; instead re-visit the toilet at a later time.    3rd banding is scheduled on 01/29/2013 at 10:30am

## 2013-01-20 ENCOUNTER — Telehealth: Payer: Self-pay | Admitting: Neurology

## 2013-01-20 NOTE — Telephone Encounter (Signed)
Please advise 

## 2013-01-21 NOTE — Telephone Encounter (Signed)
Needs Rv . I have called the patient .

## 2013-01-22 NOTE — Telephone Encounter (Signed)
Patient states she is getting calls back. Please call work phone to contact her.

## 2013-01-25 NOTE — Telephone Encounter (Signed)
Called patient to scheduled appt, no answer, lt VM message for return call

## 2013-01-26 NOTE — Telephone Encounter (Signed)
please return patients call she states this is her third attempt

## 2013-01-29 ENCOUNTER — Encounter: Payer: 59 | Admitting: Gastroenterology

## 2013-03-03 ENCOUNTER — Telehealth: Payer: Self-pay | Admitting: Neurology

## 2013-03-22 ENCOUNTER — Encounter: Payer: 59 | Admitting: Gastroenterology

## 2013-03-25 ENCOUNTER — Telehealth: Payer: Self-pay | Admitting: Gastroenterology

## 2013-03-25 NOTE — Telephone Encounter (Signed)
Returned patients call   Patient wanted to have banding on Monday Told her nothing is available So she is coming in at her scheduled appointment

## 2013-03-30 ENCOUNTER — Encounter: Payer: Self-pay | Admitting: Gastroenterology

## 2013-03-30 ENCOUNTER — Ambulatory Visit (INDEPENDENT_AMBULATORY_CARE_PROVIDER_SITE_OTHER): Payer: 59 | Admitting: Gastroenterology

## 2013-03-30 VITALS — BP 100/64 | HR 80 | Ht 68.0 in | Wt 194.6 lb

## 2013-03-30 DIAGNOSIS — K648 Other hemorrhoids: Secondary | ICD-10-CM

## 2013-03-30 DIAGNOSIS — K649 Unspecified hemorrhoids: Secondary | ICD-10-CM

## 2013-03-30 NOTE — Progress Notes (Signed)
PROCEDURE NOTE: The patient presents with symptomatic grade *2**  hemorrhoids, requesting rubber band ligation of his/her hemorrhoidal disease.  All risks, benefits and alternative forms of therapy were described and informed consent was obtained.   The anorectum was pre-medicated with lubricant and nitroglycerine ointment The decision was made to band the **left lateral* internal hemorrhoid, and the [facility] O'Regan System was used to perform band ligation without complication.  Digital anorectal examination was then performed to assure proper positioning of the band, and to adjust the banded tissue as required.  The patient was discharged home without pain or other issues.  Dietary and behavioral recommendations were given and along with follow-up instructions.    The patient will return in *4** for  follow-up and possible additional banding as required. No complications were encountered and the patient tolerated the procedure well.   

## 2013-03-30 NOTE — Patient Instructions (Signed)
HEMORRHOID BANDING PROCEDURE    FOLLOW-UP CARE   1. The procedure you have had should have been relatively painless since the banding of the area involved does not have nerve endings and there is no pain sensation.  The rubber band cuts off the blood supply to the hemorrhoid and the band may fall off as soon as 48 hours after the banding (the band may occasionally be seen in the toilet bowl following a bowel movement). You may notice a temporary feeling of fullness in the rectum which should respond adequately to plain Tylenol or Motrin.  2. Following the banding, avoid strenuous exercise that evening and resume full activity the next day.  A sitz bath (soaking in a warm tub) or bidet is soothing, and can be useful for cleansing the area after bowel movements.     3. To avoid constipation, take two tablespoons of natural wheat bran, natural oat bran, flax, Benefiber or any over the counter fiber supplement and increase your water intake to 7-8 glasses daily.    4. Unless you have been prescribed anorectal medication, do not put anything inside your rectum for two weeks: No suppositories, enemas, fingers, etc.  5. Occasionally, you may have more bleeding than usual after the banding procedure.  This is often from the untreated hemorrhoids rather than the treated one.  Don't be concerned if there is a tablespoon or so of blood.  If there is more blood than this, lie flat with your bottom higher than your head and apply an ice pack to the area. If the bleeding does not stop within a half an hour or if you feel faint, call our office at [phone] or go to the emergency room.  6. Problems are not common; however, if there is a substantial amount of bleeding, severe pain, chills, fever or difficulty passing urine (very rare) or other problems, you should call us at [phone] or report to the nearest emergency room.  7. Do not stay seated continuously for more than 2-3 hours for a day or two  after the procedure.  Tighten your buttock muscles 10-15 times every two hours and take 10-15 deep breaths every 1-2 hours.  Do not spend more than a few minutes on the toilet if you cannot empty your bowel; instead re-visit the toilet at a later time.   Follow up in one month

## 2013-06-08 NOTE — Telephone Encounter (Signed)
Rv needed with Np

## 2013-06-09 NOTE — Telephone Encounter (Signed)
Left message at work number to see how patient was doing and asked if she needed a follow up appointment.  Told her to call office and let us know.

## 2013-11-12 ENCOUNTER — Other Ambulatory Visit: Payer: Self-pay | Admitting: Obstetrics and Gynecology

## 2013-11-15 LAB — CYTOLOGY - PAP

## 2013-11-25 ENCOUNTER — Telehealth: Payer: Self-pay | Admitting: Gastroenterology

## 2013-11-25 MED ORDER — HYDROCORTISONE ACETATE 25 MG RE SUPP: 25.0000 mg | Freq: Two times a day (BID) | RECTAL | Status: DC

## 2013-11-25 NOTE — Telephone Encounter (Signed)
L/m fo rot that med was sent to her pharmacy

## 2013-12-20 ENCOUNTER — Encounter: Payer: Self-pay | Admitting: Gastroenterology

## 2014-01-27 ENCOUNTER — Ambulatory Visit: Payer: 59 | Admitting: Gastroenterology

## 2014-11-21 ENCOUNTER — Ambulatory Visit
Admission: RE | Admit: 2014-11-21 | Discharge: 2014-11-21 | Disposition: A | Discharge status: Home / Self Care | Payer: BLUE CROSS/BLUE SHIELD | Source: Ambulatory Visit | Attending: Family Medicine | Admitting: Family Medicine

## 2014-11-21 ENCOUNTER — Other Ambulatory Visit: Payer: Self-pay | Admitting: Family Medicine

## 2014-11-21 DIAGNOSIS — T1490XA Injury, unspecified, initial encounter: Secondary | ICD-10-CM

## 2014-12-14 NOTE — Telephone Encounter (Signed)
Error

## 2015-02-15 ENCOUNTER — Other Ambulatory Visit: Payer: Self-pay | Admitting: Obstetrics and Gynecology

## 2015-02-16 LAB — CYTOLOGY - PAP

## 2016-02-21 ENCOUNTER — Other Ambulatory Visit: Payer: Self-pay | Admitting: Obstetrics and Gynecology

## 2016-02-21 DIAGNOSIS — Z124 Encounter for screening for malignant neoplasm of cervix: Secondary | ICD-10-CM | POA: Diagnosis not present

## 2016-02-21 DIAGNOSIS — Z01419 Encounter for gynecological examination (general) (routine) without abnormal findings: Secondary | ICD-10-CM | POA: Diagnosis not present

## 2016-02-26 LAB — CYTOLOGY - PAP

## 2016-05-09 DIAGNOSIS — J069 Acute upper respiratory infection, unspecified: Secondary | ICD-10-CM | POA: Diagnosis not present

## 2016-05-09 DIAGNOSIS — R509 Fever, unspecified: Secondary | ICD-10-CM | POA: Diagnosis not present

## 2016-05-27 DIAGNOSIS — Z0000 Encounter for general adult medical examination without abnormal findings: Secondary | ICD-10-CM | POA: Diagnosis not present

## 2016-05-27 DIAGNOSIS — R7303 Prediabetes: Secondary | ICD-10-CM | POA: Diagnosis not present

## 2016-05-27 DIAGNOSIS — E785 Hyperlipidemia, unspecified: Secondary | ICD-10-CM | POA: Diagnosis not present

## 2016-09-26 DIAGNOSIS — G510 Bell's palsy: Secondary | ICD-10-CM | POA: Diagnosis not present

## 2016-09-26 DIAGNOSIS — H04123 Dry eye syndrome of bilateral lacrimal glands: Secondary | ICD-10-CM | POA: Diagnosis not present

## 2016-09-26 DIAGNOSIS — M3501 Sicca syndrome with keratoconjunctivitis: Secondary | ICD-10-CM | POA: Diagnosis not present

## 2016-10-06 ENCOUNTER — Encounter (HOSPITAL_COMMUNITY): Payer: Self-pay

## 2016-10-06 ENCOUNTER — Emergency Department (HOSPITAL_COMMUNITY)
Admission: EM | Admit: 2016-10-06 | Discharge: 2016-10-06 | Disposition: A | Discharge status: Home / Self Care | Payer: 59 | Attending: Emergency Medicine | Admitting: Emergency Medicine

## 2016-10-06 DIAGNOSIS — E785 Hyperlipidemia, unspecified: Secondary | ICD-10-CM | POA: Insufficient documentation

## 2016-10-06 DIAGNOSIS — R55 Syncope and collapse: Secondary | ICD-10-CM

## 2016-10-06 LAB — URINALYSIS, ROUTINE W REFLEX MICROSCOPIC
Bilirubin Urine: NEGATIVE
Glucose, UA: NEGATIVE mg/dL
Hgb urine dipstick: NEGATIVE
Ketones, ur: NEGATIVE mg/dL
Nitrite: NEGATIVE
Protein, ur: 30 mg/dL — AB
Specific Gravity, Urine: 1.023 (ref 1.005–1.030)
WBC, UA: NONE SEEN WBC/hpf (ref 0–5)
pH: 5 (ref 5.0–8.0)

## 2016-10-06 LAB — CBC
HCT: 37.7 % (ref 36.0–46.0)
Hemoglobin: 12.6 g/dL (ref 12.0–15.0)
MCH: 30.2 pg (ref 26.0–34.0)
MCHC: 33.4 g/dL (ref 30.0–36.0)
MCV: 90.4 fL (ref 78.0–100.0)
Platelets: 182 10*3/uL (ref 150–400)
RBC: 4.17 MIL/uL (ref 3.87–5.11)
RDW: 13.2 % (ref 11.5–15.5)
WBC: 10.4 10*3/uL (ref 4.0–10.5)

## 2016-10-06 LAB — BASIC METABOLIC PANEL
Anion gap: 12 (ref 5–15)
BUN: 10 mg/dL (ref 6–20)
CO2: 21 mmol/L — ABNORMAL LOW (ref 22–32)
Calcium: 8.8 mg/dL — ABNORMAL LOW (ref 8.9–10.3)
Chloride: 104 mmol/L (ref 101–111)
Creatinine, Ser: 0.86 mg/dL (ref 0.44–1.00)
GFR calc Af Amer: >60 mL/min (ref >60)
GFR calc non Af Amer: >60 mL/min (ref >60)
Glucose, Bld: 109 mg/dL — ABNORMAL HIGH (ref 65–99)
Potassium: 3.6 mmol/L (ref 3.5–5.1)
Sodium: 137 mmol/L (ref 135–145)

## 2016-10-06 LAB — CBG MONITORING, ED: Glucose-Capillary: 94 mg/dL (ref 65–99)

## 2016-10-06 LAB — I-STAT BETA HCG BLOOD, ED (MC, WL, AP ONLY): I-stat hCG, quantitative: <5 m[IU]/mL (ref <5)

## 2016-10-06 NOTE — Discharge Instructions (Signed)
Follow up with your PCP.  You could also see your neurologist if needed.  Return for repeat event, confusion, fever.

## 2016-10-06 NOTE — ED Triage Notes (Addendum)
Pt here with sister for syncope. States they were eating and all of sudden the pt passed out and was assisted to the floor from the chair. Sister states she was limp all over. This lasted about 1-2 min and her eyes were glassy and not responsive. Pt slowly came to and then about 5 min later passed out again. Sister states at that time she started shaking all over. EMS was called and her CBG was 104 after eating a few bites of food. Pt denies pain and states she doesn't remember anything that happened. No incontinence but states she had to void badly afterwards.

## 2016-10-06 NOTE — ED Provider Notes (Signed)
[facility]Y DEPT Provider Note   CSN: [id number] Arrival date & time: 10/06/16  0534     History   Chief Complaint Chief Complaint  Patient presents with  . Loss of Consciousness    HPI Veronica Villanueva is a 41 y.o. female.  41 yo F with a cc of syncope. Went out to a club this evening, denies illegal drug use or alcohol.  Patient was sitting at a table and suddenly felt an urge to urinate. She then felt bad and passed out. The family noticed that a shook all over briefly. Was quickly back to baseline and then had a second event. He felt that her right side of her face was scrunched up.   The history is provided by the patient.  Loss of Consciousness   This is a new problem. Associated symptoms include abdominal pain. Pertinent negatives include chest pain, congestion, dizziness, fever, headaches, nausea, palpitations and vomiting.  Illness  This is a new problem. The current episode started less than 1 hour ago. The problem occurs constantly. The problem has been resolved. Associated symptoms include abdominal pain. Pertinent negatives include no chest pain, no headaches and no shortness of breath. Nothing aggravates the symptoms. Nothing relieves the symptoms. She has tried nothing for the symptoms. The treatment provided no relief.    Past Medical History:  Diagnosis Date  . 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 Bell's palsy   . H/O colposcopy with cervical biopsy 07/05/2009   CIN 3   . Hyperlipidemia   . Oligohydramnios    with first pregnancy   . Pre-diabetes   . Rectal fistula   . Trichomonas    treated    Patient Active Problem List   Diagnosis Date Noted  . Palsy, Bell's 10/14/2012  . Papanicolaou smear of cervix with low grade squamous intraepithelial lesion (LGSIL) 07/31/2011  . 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 Trichomonas   . Oligohydramnios   . H/O colposcopy with cervical biopsy 07/05/2009  . Abnormal Pap smear 06/01/2009  .  HEMORRHOIDS 11/06/2006  . HEART MURMUR, HX OF 11/06/2006    Past Surgical History:  Procedure Laterality Date  . HEMORRHOID SURGERY  2003   with fistula surgery  . TUBAL LIGATION    . WISDOM TOOTH EXTRACTION  2004    OB History    Gravida Para Term Preterm AB Living   4 2 2   2 2    SAB TAB Ectopic Multiple Live Births   2       2       Home Medications    Prior to Admission medications   Medication Sig Start Date End Date Taking? Authorizing Provider  ALPRAZolam Prudy Feeler) 0.5 MG tablet Take 0.5 mg by mouth at bedtime as needed for sleep.    [provider]  hydrocortisone (ANUSOL-HC) 25 MG suppository Place 1 suppository (25 mg total) rectally every 12 (twelve) hours. 11/25/13   Louis Meckel, MD  [employer] [employer] CAPS Take 1 capsule by mouth daily.    [provider]  Multiple Vitamin (MULTIVITAMIN) LIQD Take 5 mLs by mouth daily.    [provider]  simvastatin (ZOCOR) 40 MG tablet Take 40 mg by mouth at bedtime.    [provider]    Family History Family History  Problem Relation Age of Onset  . Hypertension Father   . Heart disease Father  on meds   . Diabetes Father        oral meds  . Hyperlipidemia Father   . Hypertension Mother   . Stomach cancer Maternal Grandmother   . Breast cancer Maternal Grandmother   . Diabetes Paternal Grandmother        insulin  . Stroke Paternal Grandfather   . Diabetes Paternal Grandfather     Social History Social History  Substance Use Topics  . Smoking status: Never Smoker  . Smokeless tobacco: Never Used  . Alcohol use 0.5 oz/week    1 Standard drinks or equivalent per week     Comment: one drink per week wine     Allergies   Patient has no known allergies.   Review of Systems Review of Systems  Constitutional: Negative for chills and fever.  HENT: Negative for congestion and rhinorrhea.   Eyes: Negative for redness and visual disturbance.  Respiratory: Negative for  shortness of breath and wheezing.   Cardiovascular: Positive for syncope. Negative for chest pain and palpitations.  Gastrointestinal: Positive for abdominal pain. Negative for nausea and vomiting.  Genitourinary: Negative for dysuria and urgency.  Musculoskeletal: Negative for arthralgias and myalgias.  Skin: Negative for pallor and wound.  Neurological: Positive for syncope. Negative for dizziness and headaches.     Physical Exam Updated Vital Signs BP 110/67 (BP Location: Right Arm)   Pulse 92   Temp 97.6 F (36.4 C) (Oral)   Resp 17   Ht 5\' 9"  (1.753 m)   Wt 81.2 kg (179 lb)   LMP 09/22/2016 (Approximate)   SpO2 100%   BMI 26.43 kg/m   Physical Exam  Constitutional: She is oriented to person, place, and time. She appears well-developed and well-nourished. No distress.  HENT:  Head: Normocephalic and atraumatic.  Eyes: Pupils are equal, round, and reactive to light. EOM are normal.  Neck: Normal range of motion. Neck supple.  Cardiovascular: Normal rate and regular rhythm.  Exam reveals no gallop and no friction rub.   No murmur heard. Pulmonary/Chest: Effort normal. She has no wheezes. She has no rales.  Abdominal: Soft. She exhibits no distension and no mass. There is no tenderness. There is no guarding.  Musculoskeletal: She exhibits no edema or tenderness.  Neurological: She is alert and oriented to person, place, and time. She has normal strength. A cranial nerve deficit is present. No sensory deficit. She displays a negative Romberg sign. Coordination and gait normal. GCS eye subscore is 4. GCS verbal subscore is 5. GCS motor subscore is 6. She displays no Babinski's sign on the right side. She displays no Babinski's sign on the left side.  Reflex Scores:      Tricep reflexes are 2+ on the right side and 2+ on the left side.      Bicep reflexes are 2+ on the right side and 2+ on the left side.      Brachioradialis reflexes are 2+ on the right side and 2+ on the left  side.      Patellar reflexes are 2+ on the right side and 2+ on the left side.      Achilles reflexes are 2+ on the right side and 2+ on the left side. R sided facial droop including forehead, chronic from bells  Skin: Skin is warm and dry. She is not diaphoretic.  Psychiatric: She has a normal mood and affect. Her behavior is normal.  Nursing note and vitals reviewed.    ED Treatments / Results  Labs (all labs ordered are listed, but only abnormal results are displayed) Labs Reviewed  BASIC METABOLIC PANEL - Abnormal; Notable for the following:       Result Value   CO2 21 (*)    Glucose, Bld 109 (*)    Calcium 8.8 (*)    All other components within normal limits  URINALYSIS, ROUTINE W REFLEX MICROSCOPIC - Abnormal; Notable for the following:    APPearance CLOUDY (*)    Protein, ur 30 (*)    Leukocytes, UA SMALL (*)    Bacteria, UA RARE (*)    Squamous Epithelial / LPF 0-5 (*)    All other components within normal limits  CBC  CBG MONITORING, ED  I-STAT BETA HCG BLOOD, ED ([facility], [facility], [facility] ONLY)    EKG  EKG Interpretation  Date/Time:  Sunday October 06 2016 05:43:40 EDT Ventricular Rate:  88 PR Interval:  184 QRS Duration: 86 QT Interval:  346 QTC Calculation: 418 R Axis:   83 Text Interpretation:  Normal sinus rhythm Normal ECG No significant change since last tracing Confirmed by Kerri Asche ([id number]) on 10/06/2016 6:36:29 AM       Radiology No results found.  Procedures Procedures (including critical care time)  Medications Ordered in ED Medications - No data to display   Initial Impression / Assessment and Plan / ED Course  I have reviewed the triage vital signs and the nursing notes.  Pertinent labs & imaging results that were available during my care of the patient were reviewed by me and considered in my medical decision making (see chart for details).     41  yo F with a Chief complaints of loss of consciousness. She had to these events. Very short lasting  did have some myoclonic jerking per history. Quickly back to baseline but does not remember the exact event. Most likely this was a syncopal event by history. She did have a possible vagal inciting event that she really had to urinate. Currently has no symptoms. Benign neuro exam other than her chronic bells findings. Discussed results with patient. Discharge home.  7:01 AM:  I have discussed the diagnosis/risks/treatment options with the patient and family and believe the pt to be eligible for discharge home to follow-up with PCP. We also discussed returning to the ED immediately if new or worsening sx occur. We discussed the sx which are most concerning (e.g., sudden worsening pain, fever, inability to tolerate by mouth) that necessitate immediate return. Medications administered to the patient during their visit and any new prescriptions provided to the patient are listed below.  Medications given during this visit Medications - No data to display   The patient appears reasonably screen and/or stabilized for discharge and I doubt any other medical condition or other [facility] requiring further screening, evaluation, or treatment in the ED at this time prior to discharge.    Final Clinical Impressions(s) / ED Diagnoses   Final diagnoses:  Syncope and collapse    New Prescriptions New Prescriptions   No medications on file     Melene Plan, DO 10/06/16 0701

## 2016-10-06 NOTE — ED Notes (Signed)
CBG of 94 in triage

## 2016-10-06 NOTE — ED Notes (Signed)
Given peanut butter, graham crackers, and a coca cola to eat/ drink on until a room is available to help keep blood sugar up.

## 2016-10-07 DIAGNOSIS — R55 Syncope and collapse: Secondary | ICD-10-CM | POA: Diagnosis not present

## 2016-10-10 ENCOUNTER — Other Ambulatory Visit: Payer: Self-pay | Admitting: Family Medicine

## 2016-10-10 ENCOUNTER — Ambulatory Visit
Admission: RE | Admit: 2016-10-10 | Discharge: 2016-10-10 | Disposition: A | Discharge status: Home / Self Care | Payer: 59 | Source: Ambulatory Visit | Attending: Family Medicine | Admitting: Family Medicine

## 2016-10-10 DIAGNOSIS — N3090 Cystitis, unspecified without hematuria: Secondary | ICD-10-CM | POA: Diagnosis not present

## 2016-10-10 DIAGNOSIS — R55 Syncope and collapse: Secondary | ICD-10-CM

## 2016-10-10 DIAGNOSIS — R51 Headache: Secondary | ICD-10-CM | POA: Diagnosis not present

## 2016-12-05 DIAGNOSIS — R7303 Prediabetes: Secondary | ICD-10-CM | POA: Diagnosis not present

## 2016-12-05 DIAGNOSIS — E7800 Pure hypercholesterolemia, unspecified: Secondary | ICD-10-CM | POA: Diagnosis not present

## 2016-12-16 ENCOUNTER — Encounter: Payer: Self-pay | Admitting: Neurology

## 2016-12-18 ENCOUNTER — Ambulatory Visit (INDEPENDENT_AMBULATORY_CARE_PROVIDER_SITE_OTHER): Payer: 59 | Admitting: Neurology

## 2016-12-18 ENCOUNTER — Encounter: Payer: Self-pay | Admitting: Neurology

## 2016-12-18 VITALS — BP 106/64 | HR 88 | Ht 69.0 in | Wt 182.6 lb

## 2016-12-18 DIAGNOSIS — G510 Bell's palsy: Secondary | ICD-10-CM

## 2016-12-18 DIAGNOSIS — R55 Syncope and collapse: Secondary | ICD-10-CM

## 2016-12-18 MED ORDER — GABAPENTIN 300 MG PO CAPS: ORAL_CAPSULE | ORAL | 0 refills | Status: DC

## 2016-12-18 NOTE — Patient Instructions (Addendum)
1.  Start gabapentin 300mg  at bedtime for 7 days, then 300mg  twice daily for 7 days, then 300mg  three times daily.  Contact me towards end of prescription and we can either refill current prescription or increase dose. 2.  We will check EEG 3.  Follow up in 3 months.

## 2016-12-18 NOTE — Procedures (Signed)
ELECTROENCEPHALOGRAM REPORT  Date of Study: 12/18/2016  Patient's Name: Veronica Villanueva MRN: [id number] Date of Birth: 1975/03/07  Clinical History: 41 year old female with syncope  Medications: Xanax Zocor  Technical Summary: A multichannel digital EEG recording measured by the international 10-20 system with electrodes applied with paste and impedances below 5000 ohms performed in our laboratory with EKG monitoring in an awake and drowsy patient.  Hyperventilation and photic stimulation were performed.  The digital EEG was referentially recorded, reformatted, and digitally filtered in a variety of bipolar and referential montages for optimal display.    Description: The patient is awake and drowsy during the recording.  During maximal wakefulness, there is a symmetric, medium voltage 10 Hz posterior dominant rhythm that attenuates with eye opening.  The record is symmetric.  During drowsiness, there is an increase in theta slowing of the background.  Stage 2 sleep is not seen.  Hyperventilation and photic stimulation did not elicit any abnormalities.  There were no epileptiform discharges or electrographic seizures seen.    EKG lead was unremarkable.  Impression: This awake and drowsy EEG is normal.    Clinical Correlation: A normal EEG does not exclude a clinical diagnosis of epilepsy.  If further clinical questions remain, prolonged EEG may be helpful.  Clinical correlation is advised.   Shon MilletAdam Derric Dealmeida, DO

## 2016-12-18 NOTE — Progress Notes (Signed)
NEUROLOGY CONSULTATION NOTE  Veronica MewSheena M Villanueva MRN: [id number] DOB: February 14, 1976  Referring provider: Dr. Valentina LucksGriffin Primary care provider: Dr. Valentina LucksGriffin  Reason for consult:  Bell's palsy, syncope  HISTORY OF PRESENT ILLNESS: Veronica Villanueva is a 41 year old right-handed African American female with history of recurrent Bell's palsy.  History supplemented by prior neurologist's notes.  Head CTs and MRIs mentioned below were personally reviewed.  She developed right sided Bell's palsy in July 2014.  She noted right sided facial droop, inability to close the right eye, right hyperacusis and ear pain and change in taste.  She presented to the ED on 09/16/12, where CT of head was normal.  She was treated with a 7 day course of acyclovir and 2 rounds of prednisone taper.  She followed up at that time with neurology.  Autoimmune workup, including ANA, CRP, ACE, SSA/SSB antibodies, were negative.  She later had MRI of brain without contrast on 11/02/12 and MRI of brain with contrast on 11/05/12, which were both unremarkable.   It took about 9 months before she had any significant resolution of right sided facial weakness.  The right side of her lip still droops slightly.  Her right eye feels tight and twitches.  She experiences intermittent episodes of right facial pain described as a severe aching in the right ear and radiates to the upper and lower face on the right side.  It can last 4 or 5 days.  Initially, it would occur around her menstrual cycle.  Now it occurs spontaneously every 2 weeks.  There is no associated nausea.  She takes Advil, which helps.  She has tried and failed massage therapy and acupuncture.  Her previous neurologist discussed the possibility of Botox.  She has residual right hyperacusis.  She presented to the ED on 10/06/16 for a syncopal episode in which she was sitting and had an urge to urinate.  She felt nauseous and then passed out.  She reportedly was shaking for a few seconds.  She  had a second event.  She returned to baseline quickly after waking up, but she did not remember the event.  She reports that she hadn't eaten for about 12 hours prior to the event.  CT of head was unremarkable.  Diagnosis uncertain but vasovagal event was suspected.  12/05/16 LABS:  CMP with Na 136, K 4, Cl 104, CO2 26, glucose 94, BUN 13, Cr 0.81, t bili 0.5, ALP 39, AST 12 and ALT 15.  PAST MEDICAL HISTORY: Past Medical History:  Diagnosis Date  . 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 Bell's palsy   . H/O colposcopy with cervical biopsy 07/05/2009   CIN 3   . Hyperlipidemia   . Oligohydramnios    with first pregnancy   . Pre-diabetes   . Rectal fistula   . Trichomonas    treated    PAST SURGICAL HISTORY: Past Surgical History:  Procedure Laterality Date  . HEMORRHOID SURGERY  2003   with fistula surgery  . TUBAL LIGATION    . WISDOM TOOTH EXTRACTION  2004    MEDICATIONS: Current Outpatient Prescriptions on File Prior to Visit  Medication Sig Dispense Refill  . ALPRAZolam (XANAX) 0.5 MG tablet Take 0.5 mg by mouth at bedtime as needed for sleep.    [facility] [employer]. [employer] CAPS Take 1 capsule by mouth daily.    . Multiple Vitamin (MULTIVITAMIN) LIQD Take 5 mLs by mouth daily.    .[name]  simvastatin (ZOCOR) 40 MG tablet Take 40 mg by mouth at bedtime.     No current facility-administered medications on file prior to visit.     ALLERGIES: No Known Allergies  FAMILY HISTORY: Family History  Problem Relation Age of Onset  . Hypertension Father   . Heart disease Father        on meds   . Diabetes Father        oral meds  . Hyperlipidemia Father   . Hypertension Mother   . Stomach cancer Maternal Grandmother   . Breast cancer Maternal Grandmother   . Diabetes Paternal Grandmother        insulin  . Stroke Paternal Grandfather   . Diabetes Paternal Grandfather     SOCIAL HISTORY: Social History   Social History  . Marital status: Married    Spouse name:  Rylo  . Number of children: 2  . Years of education: HS   Occupational History  . medical billing [employer]   Social History Main Topics  . Smoking status: Never Smoker  . Smokeless tobacco: Never Used  . Alcohol use 0.5 oz/week    1 Standard drinks or equivalent per week     Comment: one drink per week wine  . Drug use: No  . Sexual activity: Yes    Birth control/ protection: Surgical     Comment: BTL   Other Topics Concern  . Not on file   Social History Narrative   Patient lives at home with family.   Patient consumes 1 cup of caffeine daily coffee/tea.   Exercises regularly, goes to gym.    REVIEW OF SYSTEMS: Constitutional: No fevers, chills, or sweats, no generalized fatigue, change in appetite Eyes: No visual changes, double vision, eye pain Ear, nose and throat: No hearing loss, ear pain, nasal congestion, sore throat Cardiovascular: No chest pain, palpitations Respiratory:  No shortness of breath at rest or with exertion, wheezes GastrointestinaI: No nausea, vomiting, diarrhea, abdominal pain, fecal incontinence Genitourinary:  No dysuria, urinary retention or frequency Musculoskeletal:  No neck pain, back pain Integumentary: No rash, pruritus, skin lesions Neurological: as above Psychiatric: No depression, insomnia, anxiety Endocrine: No palpitations, fatigue, diaphoresis, mood swings, change in appetite, change in weight, increased thirst Hematologic/Lymphatic:  No purpura, petechiae. Allergic/Immunologic: no itchy/runny eyes, nasal congestion, recent allergic reactions, rashes  PHYSICAL EXAM: Vitals:   12/18/16 0802  BP: 106/64  Pulse: 88  SpO2: 99%   General: No acute distress.  Patient appears well-groomed.  Head:  Normocephalic/atraumatic Eyes:  fundi examined but not visualized Neck: supple, no paraspinal tenderness, full range of motion Back: No paraspinal tenderness Heart: regular rate and rhythm Lungs: Clear to auscultation  bilaterally. Vascular: No carotid bruits. Neurological Exam: Mental status: alert and oriented to person, place, and time, recent and remote memory intact, fund of knowledge intact, attention and concentration intact, speech fluent and not dysarthric, language intact. Cranial nerves: CN I: not tested CN II: pupils equal, round and reactive to light, visual fields intact CN III, IV, VI:  full range of motion, no nystagmus, no ptosis CN V: facial sensation intact CN VII: mild right upper and lower facial weakness CN VIII: mild right hyperacusis CN IX, X: gag intact, uvula midline CN XI: sternocleidomastoid and trapezius muscles intact CN XII: tongue midline Bulk & Tone: normal, no fasciculations. Motor:  5/5 throughout  Sensation: temperature and vibration sensation intact. Deep Tendon Reflexes:  2+ throughout, toes downgoing.  Finger to nose testing:  Without  dysmetria.  Heel to shin:  Without dysmetria.  Gait:  Normal station and stride.  Able to turn and tandem walk. Romberg negative.  IMPRESSION: 1.  Chronic residual right sided Bell's palsy 2.  Episode of passing out in August is consistent with syncope as she hadn't eaten that day.  However, I would like to check an EEG to be sure.  PLAN: 1.  Initiate gabapentin, titrating from 300mg  at bedtime to three times daily.  At this point, I don't recommend Botox as it is not a specific indication and that it would likely worsen facial weakness. 2.  EEG 3.  Follow up in 3 months.  Thank you for allowing me to take part in the care of this patient.  Shon Millet, DO  CC:  Maurice Small, MD

## 2016-12-24 ENCOUNTER — Telehealth: Payer: Self-pay

## 2016-12-24 NOTE — Telephone Encounter (Signed)
Called Pt, LM advising Pt of normal results

## 2016-12-24 NOTE — Telephone Encounter (Signed)
-----   Message from Drema DallasAdam R Jaffe, DO sent at 12/18/2016 10:11 AM EDT ----- EEG is normal

## 2016-12-25 ENCOUNTER — Telehealth: Payer: Self-pay | Admitting: Neurology

## 2016-12-25 NOTE — Telephone Encounter (Signed)
Called and spoke with Pt. The medication she is questioning is gabapentin. She is to begin the BID dose and wondered if ok to stay at that dose. Advsd her to try the BID for a week as recommended and if feels like it is working for her, call us back and let us know at that time. Pt understands, and will call.

## 2016-12-25 NOTE — Telephone Encounter (Signed)
Patient called needing to ask a question about her Medication. She did not know the name of the medication. Please Call. Thanks

## 2017-01-13 ENCOUNTER — Telehealth: Payer: Self-pay | Admitting: Neurology

## 2017-01-13 NOTE — Telephone Encounter (Signed)
Called and spoke with Pt. She states the gabapentin has caused decreased sensation in her genital area. She has decreased the gabapentin, back to 300mg  QD. She wants to try a reduced mg and take it BID. She said it helps the pain, but does not like this new side effect. Advsd her the call back will be tomorrow. [facility] [address] [place]

## 2017-01-13 NOTE — Telephone Encounter (Signed)
Patient called and states that she is having a reaction to the medication. She could not give me the name of the medication. She states that the reaction is that she can not masturbate and that she called and gave all of the information to the answering service on Saturday morning

## 2017-01-14 MED ORDER — GABAPENTIN 100 MG PO CAPS: 100.0000 mg | ORAL_CAPSULE | Freq: Two times a day (BID) | ORAL | 3 refills | Status: DC

## 2017-01-14 NOTE — Telephone Encounter (Signed)
Pt wants to try 100 mg BID, sent in to [facility] [address]

## 2017-01-14 NOTE — Telephone Encounter (Signed)
If gabapentin 300mg  daily is effective, then remain at that dose.  Otherwise, we can change dose to 100mg  twice daily and increase dose as needed/tolerated.

## 2017-02-26 ENCOUNTER — Encounter: Payer: Self-pay | Admitting: Gastroenterology

## 2017-02-26 ENCOUNTER — Ambulatory Visit: Payer: 59 | Admitting: Gastroenterology

## 2017-02-26 VITALS — BP 100/60 | HR 62 | Ht 69.0 in | Wt 182.0 lb

## 2017-02-26 DIAGNOSIS — K6289 Other specified diseases of anus and rectum: Secondary | ICD-10-CM

## 2017-02-26 DIAGNOSIS — K625 Hemorrhage of anus and rectum: Secondary | ICD-10-CM | POA: Diagnosis not present

## 2017-02-26 DIAGNOSIS — K602 Anal fissure, unspecified: Secondary | ICD-10-CM | POA: Diagnosis not present

## 2017-02-26 MED ORDER — AMBULATORY NON FORMULARY MEDICATION: 1 refills | Status: DC

## 2017-02-26 NOTE — Progress Notes (Signed)
[facility] Consult Note:  History: Veronica Villanueva 02/26/2017  Referring physician: Maurice SmallGriffin, [name], MD  Reason for consult/chief complaint: Hemorrhoids (Procto-foam helping ) Rectal bleeding and pain  Subjective  HPI:  This is a 42 year old woman here to see me as a new patient for rectal pain and bleeding.  She last saw Dr. Melvia Heapsobert Villanueva in February 2015.  She initially saw him in July 2014 with a reported history of hemorrhoids that have had some previous procedure that he believed to be laser therapy.  She tells me that it was a surgery in her early 6320s for a fissure, done [place] Dr. Marzetta BoardKaplan's exam revealed internal hemorrhoids, and he did 3 sessions of banding between October 2014 in February 2015.  She says she had resolution of both pain and bleeding for some time, but  gradually both things have come back and gotten worse in about the last 6 months. She has no abdominal pain, and does not feel that she has constipation.  However, she reports a bowel movement every 1-2 days, and feels it is improved if she uses a "squatty Potty".  She denies sitting for a long period of time or straining.  She is having pain with every bowel movement and occasional small volume bleeding mostly on the paper.  ROS:  Review of Systems  Constitutional: Negative for appetite change and unexpected weight change.  HENT: Negative for mouth sores and voice change.   Eyes: Negative for pain and redness.  Respiratory: Negative for cough and shortness of breath.   Cardiovascular: Negative for chest pain and palpitations.  Genitourinary: Negative for dysuria and hematuria.  Musculoskeletal: Negative for arthralgias and myalgias.  Skin: Negative for pallor and rash.  Neurological: Negative for weakness and headaches.  Hematological: Negative for adenopathy.     Past Medical History: Past Medical History:  Diagnosis Date  . 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 Bell's palsy   . H/O colposcopy with cervical biopsy 07/05/2009   CIN 3   . Hyperlipidemia   . Oligohydramnios    with first pregnancy   . Pre-diabetes   . Rectal fistula   . Trichomonas    treated     Past Surgical History: Past Surgical History:  Procedure Laterality Date  . HEMORRHOID SURGERY  2003   with fistula surgery  . TUBAL LIGATION    . WISDOM TOOTH EXTRACTION  2004     Family History: Family History  Problem Relation Age of Onset  . Hypertension Father   . Heart disease Father        on meds   . Diabetes Father        oral meds  . Hyperlipidemia Father   . Hypertension Mother   . Stomach cancer Maternal Grandmother   . Breast cancer Maternal Grandmother   . Diabetes Paternal Grandmother        insulin  . Stroke Paternal Grandfather   . Diabetes Paternal Grandfather     Social History: Social History   Socioeconomic History  . Marital status: Married    Spouse name: Veronica Villanueva  . Number of children: 2  . Years of education: HS  . Highest education level: None  Social Needs  . Financial resource strain: None  . Food insecurity - worry: None  . Food insecurity - inability: None  . Transportation needs - medical: None  . Transportation needs -  non-medical: None  Occupational History  . Occupation: medical billing    Employer: [employer]  Tobacco Use  . Smoking status: Never Smoker  . Smokeless tobacco: Never Used  Substance and Sexual Activity  . Alcohol use: Yes    Alcohol/week: 0.5 oz    Types: 1 Standard drinks or equivalent per week    Comment: one drink per week wine  . Drug use: No  . Sexual activity: Yes    Birth control/protection: Surgical    Comment: BTL  Other Topics Concern  . None  Social History Narrative   Patient lives at home with family.   Patient consumes 1 cup of caffeine daily coffee/tea.   Exercises regularly, goes to gym.    Allergies: No Known Allergies  Outpatient  Meds: Current Outpatient Medications  Medication Sig Dispense Refill  . acidophilus (RISAQUAD) CAPS capsule Take by mouth daily.    [name] ALPRAZolam (XANAX) 0.5 MG tablet Take 0.5 mg by mouth at bedtime as needed for sleep.    [name] gabapentin (NEURONTIN) 100 MG capsule Take 1 capsule (100 mg total) by mouth 2 (two) times daily. 60 capsule 3  . Krill Oil CAPS Take 1 capsule by mouth daily.    . Probiotic Product (PROBIOTIC FORMULA) CAPS Take by mouth.    . simvastatin (ZOCOR) 40 MG tablet Take 40 mg by mouth at bedtime.    . AMBULATORY NON FORMULARY MEDICATION Nitroglycerine ointment 0.125 %  Apply a pea sized amount internally three times daily. Dispense 30 GM zero refill 30 g 1   No current facility-administered medications for this visit.       ___________________________________________________________________ Objective   Exam:  BP 100/60   Pulse 62   Ht 5\' 9"  (1.753 m)   Wt 182 lb (82.6 kg)   BMI 26.88 kg/m  Entire exam chaperoned by our MA Ashley  General: this is a(n) well-appearing woman  Eyes: sclera anicteric, no redness  ENT: oral mucosa moist without lesions, no cervical or supraclavicular lymphadenopathy, good dentition  CV: RRR without murmur, S1/S2, no JVD, no peripheral edema  Resp: clear to auscultation bilaterally, normal RR and effort noted  GI: soft, no tenderness, with active bowel sounds. No guarding or palpable organomegaly noted.  Skin; warm and dry, no rash or jaundice noted  Neuro: awake, alert and oriented x 3. Normal gross motor function and fluent speech Rectal exam tender posterior anal fissure, limits DRE.  No palpable distal lesions.  Anoscopy not performed.  Labs: No data for review  Assessment: Encounter Diagnoses  Name Primary?  [name] Anal pain Yes  . Anal bleeding   . 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  I asked her to stop using Proctofoam and any suppositories.  We have outlined  a treatment plan with 0.125% nitroglycerin and lidocaine ointments to be used for at least 4 weeks until she sees me again.  I started the MiraLAX half capful per day regimen.  I am hopeful that with time in patients in these therapies, her fissure will resolve and hopefully not need colorectal surgery evaluation.   Thank you for the courtesy of this consult.  Please call me with any questions or concerns.  [name]  CC: Maurice Small, MD

## 2017-02-26 NOTE — Patient Instructions (Addendum)
If you are age 40 or older, your body mass index should be between 23-30. Your Body mass index is 26.88 kg/m. If this is out of the aforementioned range listed, please consider follow up with your Primary Care Provider.  If you are age 24 or younger, your body mass index should be between 19-25. Your Body mass index is 26.88 kg/m. If this is out of the aformentioned range listed, please consider follow up with your Primary Care Provider.   Please start using 1/2 cap of Miralax a day.  Please use RectiCare over the counter TID and before a bowel movement.    Anal Fissure, Adult An anal fissure is a small tear or crack in the skin around the opening of the butt (anus).Bleeding from the tear or crack usually stops on its own within a few minutes. The bleeding may happen every time you poop (have a bowel movement) until the tear or crack heals. Follow these instructions at home: Eating and drinking  Avoid bananas and dairy products. These foods can make it hard to poop.  Drink enough fluid to keep your pee (urine) clear or pale yellow.  Eat a lot of fruit, whole grains, and vegetables. General instructions  Keep the butt area as clean and dry as you can.  Take a warm water bath (sitz bath) as told by your doctor. Do not use soap.  Take over-the-counter and prescription medicines only as told by your doctor.  Use creams or ointments only as told by your doctor.  Keep all follow-up visits as told by your doctor. This is important. Contact a doctor if:  You have more bleeding.  You have a fever.  You have watery poop (diarrhea) that is mixed with blood.  You have pain.  You problem gets worse, not better. This information is not intended to replace advice given to you by your health care provider. Make sure you discuss any questions you have with your health care provider. Document Released: 10/03/2010 Document Revised: 07/13/2015 Document Reviewed: 05/02/2014 Elsevier  Interactive Patient Education  2018 [employer].     Patient Drug Education for Nitroglycerin Ointment  Nitroglycerin ointment (NTG) is used to help heal anal fissures. The ointment relaxes the smooth muscle around the anus and promotes blood flow which helps heal the fissure (tear). The NTG reduces anal canal pressure, which diminishes pain and spasm. We use a diluted concentration of NTG (.125%) compared to the 2% that is typically used for heart patients, and this is why you need to obtain the medication from a pharmacy which will compound your prescription.  The NTG ointment should be applied 3 times per day, or as directed.  A pea-sized drop should be placed on the tip of your finger and then gently placed inside the anus. The finger should be inserted 1/3 - 1/2 its length and may be covered with a plastic glove or finger cot. You may use Vaseline to help coat the finger or dilute the ointment. If you are advised to mix the NTG with steroid ointment, limit the steroids to one to two weeks.  The first few applications should be taken lying down, as mild light-headedness or a brief headache may occur.  It may take several weeks for the fissure to begin healing, and you will need to continue taking the medication after resolution of your symptoms.  It is important to continue the treatment for the entire time period - up to 3 months or as directed. It takes  up to two years for the healing tissue to regain the normal skin strength. You will be advised to add fiber to your diet, increase water intake to 7-8 glasses per day, take relaxing baths or sitz baths, and avoid prolonged sitting and straining on the commode. Local anesthetic ointment may be added.  Initially, the anal fissure is very inflamed, which allows more of the NTG to get into the blood. This allows for a higher incidence of the most common side effect - a headache. It is usually brief and mild, but may require Tylenol or Advil. You  may dilute the NTG further with Vaseline to decrease the headaches. As the treatment progresses and the fissure begins to heal, the headaches will tend to dissipate. Other side effects include lightheadedness, flushing, dizziness, nervousness, nausea, and vomiting. If any of these side effects persist or worsen, notify us promptly. Stop using the NTG and notify us immediately if you develop the rare side effects of severe dizziness, fainting, fast/pounding heartbeat, paleness, sweating, blurred vision, dry mouth, dark urine, bluish lips/skin/nails, unusual tiredness, severe weakness, irregular heartbeat, seizures, or chest pain. Serious allergic reactions are unusual, but seek immediate medical attention if you develop a rash, swelling, dizziness, or trouble breathing.  Tell us if you are allergic to nitrates, have severe anemia, low blood pressure, dehydration, chronic heart failure, cardiomyopathy, recent heart attack, increased pressure in the brain, or exposure to nitrates while on the job. Do not use NTG while driving or working around machinery if you are drowsy, dizzy, have lightheadedness, or blurred vision. Limit alcoholic beverages. To minimize dizziness and lightheadedness, get up slowly when rising from a sitting or lying position. The elderly may be more prone to dizziness and falling. While there are not adequate studies to confirm the safety of NTG in pregnant or breast feeding women, it has been used without incident so far. We recommend waiting at least one hour after applying the NTG ointment before breast feeding.  Do not use NTG ointment if you are taking drugs for sexual problems [e.g., sildenafil (Viagra), tadalafil (Cialis), vardenafil (Levitra)]. Use caution before taking cough-and-cold products, diet aids, or NSAIDs preparations because they may contain ingredients that could increase your blood pressure, cause a fast heartbeat, or increase chest pain (e.g., pseudoephedrine,  phenylephrine, chlorpheniramine, diphenhydramine, clemastine, ibuprofen, and naproxen). Tell us if you drink alcohol, take alteplase, migraine drugs (ergotamine), water pills/diuretics such as furosemide or hydrochlorothiazide, or other drugs for high blood pressure (beta blockers, calcium channel blockers, ACE inhibitors).  Store the NTG at room temperature and keep away from light and moisture. Close the container tightly after each use. Do not store in the bathroom. Keep away from children and pets. If you have any questions or problems please call us at  [phone].  Thank you for choosing [facility]  Dr Amada JupiterHenry Danis III

## 2017-03-03 ENCOUNTER — Ambulatory Visit: Payer: 59 | Admitting: Neurology

## 2017-03-13 ENCOUNTER — Telehealth: Payer: Self-pay | Admitting: Gastroenterology

## 2017-03-14 ENCOUNTER — Telehealth: Payer: Self-pay | Admitting: Gastroenterology

## 2017-03-14 NOTE — Telephone Encounter (Signed)
It is to check on an anal fissure, so should not take long.  She can go in a banding slot. Thanks.  - HD

## 2017-03-14 NOTE — Telephone Encounter (Signed)
Called patient back and lvm that she can purchase Miralax over the counter. If she has further questions to call back.

## 2017-03-14 NOTE — Telephone Encounter (Signed)
There are a few banding slots available, if not then 2/28 is first follow up visit. Please let me know, thanks.

## 2017-03-14 NOTE — Telephone Encounter (Signed)
Left patient a message that Dr. Myrtie Neitheranis would like to follow up to see how she is healing, she can be put in a hemorrhoid banding slot in order to get her in sooner rather than later. Asked her to call the office back.

## 2017-03-18 ENCOUNTER — Other Ambulatory Visit: Payer: Self-pay | Admitting: Neurology

## 2017-03-20 DIAGNOSIS — Z124 Encounter for screening for malignant neoplasm of cervix: Secondary | ICD-10-CM | POA: Diagnosis not present

## 2017-03-20 DIAGNOSIS — Z01419 Encounter for gynecological examination (general) (routine) without abnormal findings: Secondary | ICD-10-CM | POA: Diagnosis not present

## 2017-03-20 DIAGNOSIS — Z803 Family history of malignant neoplasm of breast: Secondary | ICD-10-CM | POA: Diagnosis not present

## 2017-03-20 DIAGNOSIS — Z800 Family history of malignant neoplasm of digestive organs: Secondary | ICD-10-CM | POA: Diagnosis not present

## 2017-03-20 DIAGNOSIS — Z8049 Family history of malignant neoplasm of other genital organs: Secondary | ICD-10-CM | POA: Diagnosis not present

## 2017-03-21 ENCOUNTER — Encounter: Payer: Self-pay | Admitting: Neurology

## 2017-03-21 ENCOUNTER — Ambulatory Visit (INDEPENDENT_AMBULATORY_CARE_PROVIDER_SITE_OTHER): Payer: 59 | Admitting: Neurology

## 2017-03-21 VITALS — BP 98/70 | HR 83 | Ht 69.0 in | Wt 182.4 lb

## 2017-03-21 DIAGNOSIS — G510 Bell's palsy: Secondary | ICD-10-CM | POA: Diagnosis not present

## 2017-03-21 DIAGNOSIS — R55 Syncope and collapse: Secondary | ICD-10-CM | POA: Diagnosis not present

## 2017-03-21 MED ORDER — GABAPENTIN 100 MG PO CAPS: 100.0000 mg | ORAL_CAPSULE | Freq: Three times a day (TID) | ORAL | 5 refills | Status: DC

## 2017-03-21 NOTE — Progress Notes (Signed)
NEUROLOGY FOLLOW UP OFFICE NOTE  Veronica Villanueva [id number]  HISTORY OF PRESENT ILLNESS: Veronica Villanueva is a 42 year old right-handed African American female who follows up for chronic residual right-sided Bell's palsy and episode of syncope.  UPDATE: She is taking gabapentin 100mg  twice daily.  It is helpful and she is tolerating the 100mg  much better than 300mg .  However, she is ready to increase dose to three times daily. She had an EEG on 12/18/16, which was normal.  No further syncopal spells.   HISTORY: She developed right sided Bell's palsy in July 2014.  She noted right sided facial droop, inability to close the right eye, right hyperacusis and ear pain and change in taste.  She presented to the ED on 09/16/12, where CT of head was normal.  She was treated with a 7 day course of acyclovir and 2 rounds of prednisone taper.  She followed up at that time with neurology.  Autoimmune workup, including ANA, CRP, ACE, SSA/SSB antibodies, were negative.  She later had MRI of brain without contrast on 11/02/12 and MRI of brain with contrast on 11/05/12, which were both unremarkable.    It took about 9 months before she had any significant resolution of right sided facial weakness.  The right side of her lip still droops slightly.  Her right eye feels tight and twitches.  She experiences intermittent episodes of right facial pain described as a severe aching in the right ear and radiates to the upper and lower face on the right side.  It can last 4 or 5 days.  Initially, it would occur around her menstrual cycle.  Now it occurs spontaneously every 2 weeks.  There is no associated nausea.  She takes Advil, which helps.  She has tried massage therapy and acupuncture which provides short-term relief.  Her previous neurologist discussed the possibility of Botox.  She has residual right hyperacusis.   She presented to the ED on 10/06/16 for a syncopal episode in which she was sitting and had an urge to  urinate.  She felt nauseous and then passed out.  She reportedly was shaking for a few seconds.  She had a second event.  She returned to baseline quickly after waking up, but she did not remember the event.  She reports that she hadn't eaten for about 12 hours prior to the event.  CT of head was unremarkable.  Diagnosis uncertain but vasovagal event was suspected.  PAST MEDICAL HISTORY: Past Medical History:  Diagnosis Date  . 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 Bell's palsy   . H/O colposcopy with cervical biopsy 07/05/2009   CIN 3   . Hyperlipidemia   . Oligohydramnios    with first pregnancy   . Pre-diabetes   . Rectal fistula   . Trichomonas    treated    MEDICATIONS: Current Outpatient Medications on File Prior to Visit  Medication Sig Dispense Refill  . acidophilus (RISAQUAD) CAPS capsule Take by mouth daily.    [name]. ALPRAZolam (XANAX) 0.5 MG tablet Take 0.5 mg by mouth at bedtime as needed for sleep.    . AMBULATORY NON FORMULARY MEDICATION Nitroglycerine ointment 0.125 %  Apply a pea sized amount internally three times daily. Dispense 30 GM zero refill 30 g 1  . cycloSPORINE (RESTASIS) 0.05 % ophthalmic emulsion Apply 1 drop to eye as directed.    [facility]. Krill Oil CAPS Take 1 capsule  by mouth daily.    . Probiotic Product (PROBIOTIC FORMULA) CAPS Take by mouth.    . simvastatin (ZOCOR) 40 MG tablet Take 40 mg by mouth at bedtime.     No current facility-administered medications on file prior to visit.     ALLERGIES: No Known Allergies  FAMILY HISTORY: Family History  Problem Relation Age of Onset  . Hypertension Father   . Heart disease Father        on meds   . Diabetes Father        oral meds  . Hyperlipidemia Father   . Hypertension Mother   . Stomach cancer Maternal Grandmother   . Breast cancer Maternal Grandmother   . Diabetes Paternal Grandmother        insulin  . Stroke Paternal Grandfather   . Diabetes Paternal Grandfather      SOCIAL HISTORY: Social History   Socioeconomic History  . Marital status: Married    Spouse name: Rylo  . Number of children: 2  . Years of education: HS  . Highest education level: Not on file  Social Needs  . Financial resource strain: Not on file  . Food insecurity - worry: Not on file  . Food insecurity - inability: Not on file  . Transportation needs - medical: Not on file  . Transportation needs - non-medical: Not on file  Occupational History  . Occupation: medical billing    Employer: [employer]  Tobacco Use  . Smoking status: Never Smoker  . Smokeless tobacco: Never Used  Substance and Sexual Activity  . Alcohol use: Yes    Alcohol/week: 0.5 oz    Types: 1 Standard drinks or equivalent per week    Comment: one drink per week wine  . Drug use: No  . Sexual activity: Yes    Birth control/protection: Surgical    Comment: BTL  Other Topics Concern  . Not on file  Social History Narrative   Patient lives at home with family.   Patient consumes 1 cup of caffeine daily coffee/tea.   Exercises regularly, goes to gym.    REVIEW OF SYSTEMS: Constitutional: No fevers, chills, or sweats, no generalized fatigue, change in appetite Eyes: No visual changes, double vision, eye pain Ear, nose and throat: No hearing loss, ear pain, nasal congestion, sore throat Cardiovascular: No chest pain, palpitations Respiratory:  No shortness of breath at rest or with exertion, wheezes GastrointestinaI: No nausea, vomiting, diarrhea, abdominal pain, fecal incontinence Genitourinary:  No dysuria, urinary retention or frequency Musculoskeletal:  No neck pain, back pain Integumentary: No rash, pruritus, skin lesions Neurological: as above Psychiatric: No depression, insomnia, anxiety Endocrine: No palpitations, fatigue, diaphoresis, mood swings, change in appetite, change in weight, increased thirst Hematologic/Lymphatic:  No purpura, petechiae. Allergic/Immunologic: no  itchy/runny eyes, nasal congestion, recent allergic reactions, rashes  PHYSICAL EXAM: Vitals:   03/21/17 0730  BP: 98/70  Pulse: 83  SpO2: 100%   General: No acute distress.  Patient appears well-groomed.   Head:  Normocephalic/atraumatic Eyes:  Fundi examined but not visualized Neck: supple, no paraspinal tenderness, full range of motion Heart:  Regular rate and rhythm Lungs:  Clear to auscultation bilaterally Back: No paraspinal tenderness Neurological Exam: alert and oriented to person, place, and time. Attention span and concentration intact, recent and remote memory intact, fund of knowledge intact.  Speech fluent and not dysarthric, language intact.  Mild right upper and lower facial weakness.  Mild right hyperacusis.  Otherwise, CN II-XII intact. Bulk and tone  normal, muscle strength 5/5 throughout.  Sensation to light touch  intact.  Deep tendon reflexes 2+ throughout.  Finger to nose testing intact.  Gait normal, Romberg negative.   IMPRESSION: Chronic residual right sided Bell's palsy Syncope.  No recurrent spells.  PLAN: 1.  Increase gabapentin 100mg  to three times daily.  She will contact us if we need to further adjust dose. 2.  Follow up in 5 months.  17 minutes spent face to face with patient, over 50% spent discussing management and prognosis.  Shon Millet, DO  CC:  Maurice Small, MD

## 2017-03-21 NOTE — Patient Instructions (Signed)
1.  Increase gabapentin to 100mg  three times daily 2.  Follow up in 5 months.  If we need to adjust dose of gabapentin, contact me and I will make necessary changes.

## 2017-03-26 ENCOUNTER — Ambulatory Visit: Payer: 59 | Admitting: Gastroenterology

## 2017-04-01 ENCOUNTER — Ambulatory Visit: Payer: 59 | Admitting: Gastroenterology

## 2017-04-01 ENCOUNTER — Encounter: Payer: Self-pay | Admitting: Gastroenterology

## 2017-04-01 VITALS — BP 100/60 | HR 70 | Ht 69.0 in | Wt 186.0 lb

## 2017-04-01 DIAGNOSIS — K6289 Other specified diseases of anus and rectum: Secondary | ICD-10-CM | POA: Diagnosis not present

## 2017-04-01 DIAGNOSIS — K602 Anal fissure, unspecified: Secondary | ICD-10-CM | POA: Diagnosis not present

## 2017-04-01 NOTE — Patient Instructions (Signed)
If you are age 40 or older, your body mass index should be between 23-30. Your Body mass index is 27.47 kg/m. If this is out of the aforementioned range listed, please consider follow up with your Primary Care Provider.  If you are age 40 or younger, your body mass index should be between 19-25. Your Body mass index is 27.47 kg/m. If this is out of the aformentioned range listed, please consider follow up with your Primary Care Provider.   Follow up as needed. [phone]   Thank you for choosing [facility]  Dr Amada JupiterHenry Danis III

## 2017-04-01 NOTE — Progress Notes (Signed)
     [facility] Progress Note  Chief Complaint: Anal fissure  Subjective  History:  Veronica Villanueva follows up for reevaluation of the recently discovered posterior anal fissure.  See that office note for details. She is doing "90% better".  She has some occasional brief pain, bleeding stopped quite a while ago.  Bowel habits are more regular on half a capful a day of MiraLAX. ROS: Cardiovascular:  no chest pain Respiratory: no dyspnea  The patient's Past Medical, Family and Social History were reviewed and are on file in the EMR.  Objective:  Med list reviewed  Current Outpatient Medications:  .  acidophilus (RISAQUAD) CAPS capsule, Take by mouth daily., Disp: , Rfl:  .  ALPRAZolam (XANAX) 0.5 MG tablet, Take 0.5 mg by mouth at bedtime as needed for sleep., Disp: , Rfl:  .  AMBULATORY NON FORMULARY MEDICATION, Nitroglycerine ointment 0.125 %  Apply a pea sized amount internally three times daily. Dispense 30 GM zero refill, Disp: 30 g, Rfl: 1 .  cycloSPORINE (RESTASIS) 0.05 % ophthalmic emulsion, Apply 1 drop to eye as directed., Disp: , Rfl:  .  gabapentin (NEURONTIN) 100 MG capsule, Take 1 capsule (100 mg total) by mouth 3 (three) times daily., Disp: 90 capsule, Rfl: 5 .  Krill Oil CAPS, Take 1 capsule by mouth daily., Disp: , Rfl:  .  Probiotic Product (PROBIOTIC FORMULA) CAPS, Take by mouth., Disp: , Rfl:  .  simvastatin (ZOCOR) 40 MG tablet, Take 40 mg by mouth at bedtime., Disp: , Rfl:    Vital signs in last 24 hrs: Vitals:   04/01/17 1607  BP: 100/60  Pulse: 70    Physical Exam  Well-appearing   Abdomen: soft, no tenderness, with active bowel sounds. No guarding or palpable hepatosplenomegaly.  Rectal: Normal external, palpable hypertrophied anal fissure, fissure has nearly healed, no tenderness, no palpable internal lesions, I was able to complete the DRE unlike a month ago.    @ASSESSMENTPLANBEGIN @ Assessment: Encounter Diagnoses  Name Primary?  [name]. Anal  fissure Yes  . Anal pain    Almost complete resolution of anal fissure with lidocaine and nitroglycerin ointments and low-dose MiraLAX.   Plan: Continue ointments for another 2 weeks, continue MiraLAX for another 2 months or longer if needed to avoid constipation and recurrence of fissure.   Total time 15 minutes, over half spent in counseling and coordination of care.   Charlie PitterHenry L Danis III

## 2017-04-24 ENCOUNTER — Other Ambulatory Visit: Payer: Self-pay

## 2017-04-24 ENCOUNTER — Telehealth: Payer: Self-pay | Admitting: Gastroenterology

## 2017-04-24 MED ORDER — AMBULATORY NON FORMULARY MEDICATION: 1 refills | Status: DC

## 2017-04-24 NOTE — Telephone Encounter (Signed)
Patient called reporting that she had missed a couple doses of Miralax and became constipated again, had some more symptoms of anal fissure. She is back on the Miralax but does need a refill on nitroglycerin ointment. She was concerned that her request was denied. She had used more of the first tube of ointment, didn't realize it was only supposed to be a pea size of ointment, therefore ran out sooner. Checked with Sheralyn Boatmanoni about this request,it did not come through correctly, Rx had to be printed off and sent in to pharmacy for one refill. Patient will continue for another week until healed, understands to call if not better. Patient encouraged to make sure she is taking Miralax, drinking plenty of water so as not to get constipated.

## 2017-06-13 ENCOUNTER — Encounter: Payer: Self-pay | Admitting: Neurology

## 2017-07-03 ENCOUNTER — Telehealth: Payer: Self-pay | Admitting: Gastroenterology

## 2017-07-03 NOTE — Telephone Encounter (Signed)
Patient requesting to speak with nurse about on going symptoms from fissure and hemorrhoids. Pt has appt set for 5.30.19

## 2017-07-03 NOTE — Telephone Encounter (Signed)
Patient states she has been using the Nitroglycerine ointment as prescribed, Miralax and is still not completely healed. She has appointment on 5/30 with you, but wanted to know what is the next step.

## 2017-07-04 NOTE — Telephone Encounter (Signed)
She can wait for me to evaluate it on 5/30 or, if she is fairly confident that it is not healed, then she needs a referral to Colorectal surgery.

## 2017-07-04 NOTE — Telephone Encounter (Signed)
Patient decided to wait to be seen first before proceeding with anything else. Will keep the 5/30 appointment.

## 2017-07-08 DIAGNOSIS — E785 Hyperlipidemia, unspecified: Secondary | ICD-10-CM | POA: Diagnosis not present

## 2017-07-08 DIAGNOSIS — Z0000 Encounter for general adult medical examination without abnormal findings: Secondary | ICD-10-CM | POA: Diagnosis not present

## 2017-07-08 DIAGNOSIS — R7303 Prediabetes: Secondary | ICD-10-CM | POA: Diagnosis not present

## 2017-07-17 ENCOUNTER — Encounter: Payer: Self-pay | Admitting: Gastroenterology

## 2017-07-17 ENCOUNTER — Ambulatory Visit: Payer: 59 | Admitting: Gastroenterology

## 2017-07-17 ENCOUNTER — Encounter (INDEPENDENT_AMBULATORY_CARE_PROVIDER_SITE_OTHER): Payer: Self-pay

## 2017-07-17 VITALS — BP 110/60 | HR 64 | Ht 69.0 in | Wt 187.0 lb

## 2017-07-17 DIAGNOSIS — K602 Anal fissure, unspecified: Secondary | ICD-10-CM | POA: Diagnosis not present

## 2017-07-17 NOTE — Patient Instructions (Signed)
You have been scheduled for an appointment with          at [facility]. Your appointment is on         at         . Please arrive at             for registration. Make certain to bring a list of current medications, including any over the counter medications or vitamins. Also bring your co-pay if you have one as well as your insurance cards. [facility] is located at [address]. Should you need to reschedule your appointment, please contact them at [phone].  If you are age 40 or older, your body mass index should be between 23-30. Your Body mass index is 27.62 kg/m. If this is out of the aforementioned range listed, please consider follow up with your Primary Care Provider.  If you are age 52 or younger, your body mass index should be between 19-25. Your Body mass index is 27.62 kg/m. If this is out of the aformentioned range listed, please consider follow up with your Primary Care Provider.   It was a pleasure to see you today!  Dr. Myrtie Neither

## 2017-07-17 NOTE — Progress Notes (Signed)
     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  It is been going on about 6 months now, and she has been faithful with MiraLAX, nitroglycerin ointment and Recticare ointment.  She was not having constipation before the MiraLAX, stool is soft on MiraLAX.  She has had occasional brief right lower quadrant pain occurring once or twice a month, usually midcycle and during her menses.  She denies chronic diarrhea nausea or vomiting.  She also had a fissure many years ago and surgery for that that she says "was awful".  ROS: Cardiovascular:  no chest pain Respiratory: no dyspnea  The patient's Past Medical, Family and Social History were reviewed and are on file in the EMR.  Objective:  Med list reviewed  Current Outpatient Medications:  .  ALPRAZolam (XANAX) 0.5 MG tablet, Take 0.5 mg by mouth at bedtime as needed for sleep., Disp: , Rfl:  .  AMBULATORY NON FORMULARY MEDICATION, Nitroglycerine ointment 0.125 %  Apply a pea sized amount internally three times daily. Dispense 30 GM zero refill, Disp: 30 g, Rfl: 1 .  cycloSPORINE (RESTASIS) 0.05 % ophthalmic emulsion, Apply 1 drop to eye as directed., Disp: , Rfl:  .  gabapentin (NEURONTIN) 100 MG capsule, Take 1 capsule (100 mg total) by mouth 3 (three) times daily., Disp: 90 capsule, Rfl: 5 .  Probiotic Product (PROBIOTIC FORMULA) CAPS, Take by mouth., Disp: , Rfl:  .  simvastatin (ZOCOR) 40 MG tablet, Take 40 mg by mouth at bedtime., Disp: , Rfl:    Vital signs in last 24 hrs: Vitals:   07/17/17 1335  BP: 110/60  Pulse: 64    Physical Exam Rectal:  Abdomen: soft, no tenderness, with active bowel sounds. No guarding or palpable hepatosplenomegaly.  Tender posterior anal fissure, causes difficulty continue exam.  No drainage.,  No perianal findings.   @ Assessment: Encounter Diagnosis  Name Primary?  Veronica Villanueva Anal fissure  Yes   Nonhealing anal fissure.  I do not think this episodic right lower quadrant pain represents Crohn's disease.  Her fissure is any typical posterior location.   Plan: Colorectal surgery for consideration of Botox treatment that will hopefully allow healing; surgical sphincterotomy would be needed if not.   Total time 15 minutes, over half spent face-to-face with patient in counseling and coordination of care.   Veronica Villanueva

## 2017-07-18 ENCOUNTER — Telehealth: Payer: Self-pay

## 2017-07-18 NOTE — Telephone Encounter (Signed)
Left a message for Veronica Villanueva at [facility] to follow up on urgent referral for rectal fissure (non-healing)

## 2017-07-18 NOTE — Telephone Encounter (Signed)
Pt. has been scheduled for 07-30-2016. Per Maralyn Sago this is the date the patient requested.

## 2017-07-30 ENCOUNTER — Ambulatory Visit: Payer: Self-pay | Admitting: Surgery

## 2017-07-30 DIAGNOSIS — K601 Chronic anal fissure: Secondary | ICD-10-CM | POA: Diagnosis not present

## 2017-07-30 NOTE — H&P (Signed)
CC: Referral by Dr. Myrtie Villanueva for evaluation of chronic anal fissure  HPI: Veronica Villanueva is a very pleasant 42-year-old female with history of hyperlipidemia. A for evaluation of anal pain. She has been seen and treated by Dr. Myrtie Villanueva. She reports history of anal pain that dates back to January of this year. The pain occurs with each bowel movement and sharp/knifelike. She denies any bleeding associated with her bowel movements including bright red blood in the toilet bowl or on the toilet paper. She has been taking MiraLAX and applying topical nitroglycerin cream for 6 months. She reports improvement but failure to completely resolve with regard to her symptoms. They have plateaued in the last 2 months.  Of note, she does report having had a prior fissure surgery 20 years ago for an anal fissure in [place] but is unsure of what was actually done. She had improvement in her symptoms following surgery with subsequent recurrence 6 months ago  She denies any history of incontinence to gas, liquid stool, solid stool. She denies any issues with urinary incontinence.   PMH: Hyperlipidemia (well-controlled on statin)  PSH: Some procedure 20 years ago for anal fissure in [place], unclear what was done  OB/GYN: G3 P2. 2 prior vaginal deliveries  FHx: Denies FHx of malignancy  Social: Denies use of tobacco/EtOH/drugs  ROS: A comprehensive 10 system review of systems was completed with the patient and pertinent findings as noted above.  The patient is a 42 year old female.   Past Surgical History (Veronica Villanueva, RMA; 07/30/2017 10:48 AM) Veronica Villanueva Fissure Repair   Diagnostic Studies History (Veronica Villanueva, RMA; 07/30/2017 10:48 AM) Mammogram  1-3 years ago Pap Smear  1-5 years ago  Allergies (Veronica Villanueva, RMA; 07/30/2017 10:49 AM) No Known Drug Allergies [07/30/2017]: Allergies Reconciled   Medication History (Veronica Villanueva, RMA; 07/30/2017 10:49 AM) Restasis (0.05% Emulsion,  Ophthalmic) Active. Simvastatin (40MG  Tablet, Oral) Active. ALPRAZolam (0.25MG  Tablet, Oral) Active. Multi-Vitamin (Oral) Active. Medications Reconciled  Social History (Veronica Villanueva, RMA; 07/30/2017 10:48 AM) Alcohol use  Moderate alcohol use. Caffeine use  Coffee, Tea. No drug use  Tobacco use  Never smoker.  Family History (Veronica Villanueva, RMA; 07/30/2017 10:48 AM) Arthritis  Father. Diabetes Mellitus  Father. Heart Disease  Father. Heart disease in female family member before age 54  Hypertension  Father.  Pregnancy / Birth History (Veronica Villanueva, RMA; 07/30/2017 10:48 AM) Age at menarche  13 years. Contraceptive History  Oral contraceptives. Gravida  3 Length (months) of breastfeeding  7-12 Maternal age  53-25 Para  2  Other Problems (Veronica Villanueva, RMA; 07/30/2017 10:48 AM) Hemorrhoids     Review of Systems Veronica Villanueva M. Perfecto Purdy MD; 07/30/2017 11:04 AM) General Not Present- Appetite Loss, Chills, Fatigue, Fever, Night Sweats, Weight Gain and Weight Loss. Skin Not Present- Change in Wart/Mole, Dryness, Hives, Jaundice, New Lesions, Non-Healing Wounds, Rash and Ulcer. HEENT Not Present- Earache, Hearing Loss, Hoarseness, Nose Bleed, Oral Ulcers, Ringing in the Ears, Seasonal Allergies, Sinus Pain, Sore Throat, Visual Disturbances, Wears glasses/contact lenses and Yellow Eyes. Respiratory Not Present- Bloody sputum, Chronic Cough, Difficulty Breathing, Snoring and Wheezing. Breast Not Present- Breast Mass, Breast Pain, Nipple Discharge and Skin Changes. Cardiovascular Not Present- Chest Pain, Difficulty Breathing Lying Down, Leg Cramps, Palpitations, Rapid Heart Rate, Shortness of Breath and Swelling of Extremities. Gastrointestinal Present- Hemorrhoids and Rectal Pain. Not Present- Abdominal Pain, Bloating, Bloody Stool, Change in Bowel Habits, Chronic diarrhea, Constipation, Difficulty Swallowing, Excessive gas,  Gets full quickly at meals,  Indigestion, Nausea and Vomiting. Female Genitourinary Not Present- Frequency, Nocturia, Painful Urination, Pelvic Pain and Urgency. Musculoskeletal Not Present- Back Pain, Joint Pain, Joint Stiffness, Muscle Pain, Muscle Weakness and Swelling of Extremities. Neurological Not Present- Decreased Memory, Fainting, Headaches, Numbness, Seizures, Tingling, Tremor, Trouble walking and Weakness. Psychiatric Not Present- Anxiety, Bipolar, Change in Sleep Pattern, Depression, Fearful and Frequent crying. Endocrine Not Present- Cold Intolerance, Excessive Hunger, Hair Changes, Heat Intolerance, Hot flashes and New Diabetes. Hematology Not Present- Blood Thinners, Easy Bruising, Excessive bleeding, Gland problems, HIV and Persistent Infections.  Vitals (Veronica Villanueva RMA; 07/30/2017 10:48 AM) 07/30/2017 10:48 AM Weight: 188.4 lb Height: 69in Body Surface Area: 2.01 m Body Mass Index: 27.82 kg/m  Temp.: 97.72F  Pulse: 72 (Regular)  BP: 112/68 (Sitting, Left Arm, Standard)       Physical Exam Veronica Villanueva(Veronica Villanueva M. Eh Sauseda MD; 07/30/2017 11:18 AM) The physical exam findings are as follows: Note:Constitutional: No acute distress; conversant; no deformities Eyes: Moist conjunctiva; no lid lag; anicteric sclerae; pupils equal round and reactive to light Neck: Trachea midline; no palpable thyromegaly Lungs: Normal respiratory effort; no tactile fremitus CV: Regular rate and rhythm; no palpable thrill; no pitting edema GI: Abdomen soft, nontender, nondistended; no palpable hepatosplenomegaly Anorectal: Posterior midline anal fissure with small associated skin tags. DRE and anoscopy deferred due to fissure MSK: Normal gait; no clubbing/cyanosis Psychiatric: Appropriate affect; alert and oriented 3 Lymphatic: No palpable cervical or axillary lymphadenopathy **A chaperone, Veronica Villanueva, was present for the entire physical exam    Assessment & Plan Veronica Villanueva(Veronica Villanueva M. 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 Veronica Villanueva is a pleasant 42yoF with hx HLD here today for evaluation of chronic anal fissure -She has failed 6 months of medical treatment with topical nitroglycerin and MiraLAX as well as rectal care. The improvement in discomfort she notes is related to application of rectal care. On exam she does have a anal fissure in the posterior midline. She does has a small associated skin tag that she is also requesting to be removed. -I discussed options going forward including trial of topical nifedipine cream which has a slightly higher efficacy than topical nitroglycerin but in the setting of a chronic anal fissure, is unlikely to change anything. I discussed the importance of starting a daily fiber supplement like Metamucil. -Given that this now chronic, she would benefit from more definitive procedure. We discussed options and given her obstetrical history as well as a possible prior sphincterotomy versus fissurectomy, I think she would be a better candidate for trial of Botox -Will plan exam under anesthesia, injection of Botox, excision of associated skin tag -The anatomy and physiology of the anal canal was discussed at length with the patient. The pathophysiology of anal fissures was discussed at length with associated pictures and illustrations. -The planned procedure, material risks (including, but not limited to, pain, bleeding, infection, scarring, damage to anal sphincter, incontinence of gas and/or stool, need for additional procedures, recurrence, pneumonia, heart attack, stroke, death) benefits and alternatives to surgery were discussed at length. I noted a good probability that the procedure would help improve her symptoms. The patient's questions were answered to her satisfaction, she voiced understanding and elected to proceed with surgery. Additionally, we discussed typical postoperative expectations and the recovery process.  Signed electronically  by Andria Meusehristopher M Roberto Hlavaty, MD (07/30/2017 11:21 AM)

## 2017-07-31 ENCOUNTER — Encounter (HOSPITAL_BASED_OUTPATIENT_CLINIC_OR_DEPARTMENT_OTHER): Payer: Self-pay | Admitting: *Deleted

## 2017-07-31 ENCOUNTER — Other Ambulatory Visit: Payer: Self-pay

## 2017-07-31 NOTE — Progress Notes (Addendum)
Spoke w/ pt via phone for pre-op interview.  Npo after mn w/ exception clear liquids until 0600 (no cream /milk products).  Arrive at 1000.  Getting cbc/diff, cmet, and ekg done Friday 08-01-2017 @ 1300.  Will take gabapentin dos w/ sips of water.   ADDENDUM:  PT HAS CURRENT IN Epic DATED 10-06-2016, SHE NOT NEED ANOTHER ONE TODAY.  PLACED IN CHART.

## 2017-08-01 ENCOUNTER — Encounter (HOSPITAL_COMMUNITY)
Admission: RE | Admit: 2017-08-01 | Discharge: 2017-08-01 | Disposition: A | Discharge status: Home / Self Care | Payer: 59 | Source: Ambulatory Visit | Attending: Surgery | Admitting: Surgery

## 2017-08-01 DIAGNOSIS — Z01812 Encounter for preprocedural laboratory examination: Secondary | ICD-10-CM | POA: Diagnosis not present

## 2017-08-01 LAB — CBC WITH DIFFERENTIAL/PLATELET
Basophils Absolute: 0 10*3/uL (ref 0.0–0.1)
Basophils Relative: 0 %
Eosinophils Absolute: 0.5 10*3/uL (ref 0.0–0.7)
Eosinophils Relative: 7 %
HCT: 38.5 % (ref 36.0–46.0)
Hemoglobin: 12.7 g/dL (ref 12.0–15.0)
Lymphocytes Relative: 29 %
Lymphs Abs: 2.2 10*3/uL (ref 0.7–4.0)
MCH: 31.8 pg (ref 26.0–34.0)
MCHC: 33 g/dL (ref 30.0–36.0)
MCV: 96.5 fL (ref 78.0–100.0)
Monocytes Absolute: 0.6 10*3/uL (ref 0.1–1.0)
Monocytes Relative: 8 %
Neutro Abs: 4.4 10*3/uL (ref 1.7–7.7)
Neutrophils Relative %: 56 %
Platelets: 174 10*3/uL (ref 150–400)
RBC: 3.99 MIL/uL (ref 3.87–5.11)
RDW: 13.3 % (ref 11.5–15.5)
WBC: 7.8 10*3/uL (ref 4.0–10.5)

## 2017-08-01 LAB — COMPREHENSIVE METABOLIC PANEL
ALT: 17 U/L (ref 14–54)
AST: 17 U/L (ref 15–41)
Albumin: 4 g/dL (ref 3.5–5.0)
Alkaline Phosphatase: 47 U/L (ref 38–126)
Anion gap: 5 (ref 5–15)
BUN: 18 mg/dL (ref 6–20)
CO2: 28 mmol/L (ref 22–32)
Calcium: 9.1 mg/dL (ref 8.9–10.3)
Chloride: 108 mmol/L (ref 101–111)
Creatinine, Ser: 0.79 mg/dL (ref 0.44–1.00)
GFR calc Af Amer: >60 mL/min (ref >60)
GFR calc non Af Amer: >60 mL/min (ref >60)
Glucose, Bld: 80 mg/dL (ref 65–99)
Potassium: 4.1 mmol/L (ref 3.5–5.1)
Sodium: 141 mmol/L (ref 135–145)
Total Bilirubin: 0.5 mg/dL (ref 0.3–1.2)
Total Protein: 7 g/dL (ref 6.5–8.1)

## 2017-08-07 ENCOUNTER — Ambulatory Visit (HOSPITAL_BASED_OUTPATIENT_CLINIC_OR_DEPARTMENT_OTHER): Payer: 59 | Admitting: Anesthesiology

## 2017-08-07 ENCOUNTER — Encounter (HOSPITAL_BASED_OUTPATIENT_CLINIC_OR_DEPARTMENT_OTHER): Payer: Self-pay

## 2017-08-07 ENCOUNTER — Ambulatory Visit (HOSPITAL_BASED_OUTPATIENT_CLINIC_OR_DEPARTMENT_OTHER)
Admission: RE | Admit: 2017-08-07 | Discharge: 2017-08-07 | Disposition: A | Discharge status: Home / Self Care | Payer: 59 | Source: Other Acute Inpatient Hospital | Attending: Surgery | Admitting: Surgery

## 2017-08-07 ENCOUNTER — Encounter (HOSPITAL_BASED_OUTPATIENT_CLINIC_OR_DEPARTMENT_OTHER)
Admission: RE | Disposition: A | Discharge status: Home / Self Care | Payer: Self-pay | Source: Other Acute Inpatient Hospital | Attending: Surgery

## 2017-08-07 DIAGNOSIS — K6289 Other specified diseases of anus and rectum: Secondary | ICD-10-CM | POA: Insufficient documentation

## 2017-08-07 DIAGNOSIS — E785 Hyperlipidemia, unspecified: Secondary | ICD-10-CM | POA: Diagnosis not present

## 2017-08-07 DIAGNOSIS — K601 Chronic anal fissure: Secondary | ICD-10-CM | POA: Diagnosis not present

## 2017-08-07 DIAGNOSIS — K644 Residual hemorrhoidal skin tags: Secondary | ICD-10-CM | POA: Diagnosis not present

## 2017-08-07 HISTORY — DX: Personal history of other infectious and parasitic diseases: Z86.19

## 2017-08-07 HISTORY — DX: Dry eye syndrome of unspecified lacrimal gland: H04.129

## 2017-08-07 HISTORY — DX: Personal history of other specified conditions: Z87.898

## 2017-08-07 HISTORY — DX: Bell's palsy: G51.0

## 2017-08-07 HISTORY — PX: SPHINCTEROTOMY: SHX5279

## 2017-08-07 HISTORY — DX: Personal history of cervical dysplasia: Z87.410

## 2017-08-07 HISTORY — PX: EXCISION OF SKIN TAG: SHX6270

## 2017-08-07 HISTORY — DX: Chronic anal fissure: K60.1

## 2017-08-07 LAB — GLUCOSE, CAPILLARY: Glucose-Capillary: 91 mg/dL (ref 65–99)

## 2017-08-07 SURGERY — EXAM UNDER ANESTHESIA
Anesthesia: General | Site: Rectum

## 2017-08-07 MED ORDER — PROPOFOL 10 MG/ML IV BOLUS
INTRAVENOUS | Status: DC | PRN
  Administered 2017-08-07: 200 mg via INTRAVENOUS
  Administered 2017-08-07: 20 mg via INTRAVENOUS

## 2017-08-07 MED ORDER — MIDAZOLAM HCL 5 MG/5ML IJ SOLN
INTRAMUSCULAR | Status: DC | PRN
  Administered 2017-08-07: 2 mg via INTRAVENOUS

## 2017-08-07 MED ORDER — PROPOFOL 10 MG/ML IV BOLUS
INTRAVENOUS | Status: AC
Start: 2017-08-07 — End: ?
  Filled 2017-08-07: qty 40

## 2017-08-07 MED ORDER — ONDANSETRON HCL 4 MG/2ML IJ SOLN
INTRAMUSCULAR | Status: DC | PRN
  Administered 2017-08-07: 4 mg via INTRAVENOUS

## 2017-08-07 MED ORDER — DEXAMETHASONE SODIUM PHOSPHATE 10 MG/ML IJ SOLN
INTRAMUSCULAR | Status: AC
  Filled 2017-08-07: qty 1

## 2017-08-07 MED ORDER — CHLORHEXIDINE GLUCONATE CLOTH 2 % EX PADS
6.0000 | MEDICATED_PAD | Freq: Once | CUTANEOUS | Status: DC
  Filled 2017-08-07: qty 6

## 2017-08-07 MED ORDER — DEXAMETHASONE SODIUM PHOSPHATE 4 MG/ML IJ SOLN
INTRAMUSCULAR | Status: DC | PRN
  Administered 2017-08-07: 10 mg via INTRAVENOUS

## 2017-08-07 MED ORDER — FENTANYL CITRATE (PF) 100 MCG/2ML IJ SOLN
INTRAMUSCULAR | Status: DC | PRN
  Administered 2017-08-07 (×2): 50 ug via INTRAVENOUS

## 2017-08-07 MED ORDER — CELECOXIB 200 MG PO CAPS
200.0000 mg | ORAL_CAPSULE | ORAL | Status: AC
  Administered 2017-08-07: 200 mg via ORAL
  Filled 2017-08-07: qty 1

## 2017-08-07 MED ORDER — SCOPOLAMINE 1 MG/3DAYS TD PT72
1.0000 | MEDICATED_PATCH | Freq: Once | TRANSDERMAL | Status: DC
  Administered 2017-08-07: 1.5 mg via TRANSDERMAL
  Filled 2017-08-07: qty 1

## 2017-08-07 MED ORDER — LIDOCAINE 2% (20 MG/ML) 5 ML SYRINGE
INTRAMUSCULAR | Status: AC
  Filled 2017-08-07: qty 5

## 2017-08-07 MED ORDER — ONDANSETRON HCL 4 MG/2ML IJ SOLN
INTRAMUSCULAR | Status: AC
  Filled 2017-08-07: qty 2

## 2017-08-07 MED ORDER — SODIUM CHLORIDE 0.9 % IR SOLN
Status: DC | PRN
  Administered 2017-08-07: 500 mL

## 2017-08-07 MED ORDER — FENTANYL CITRATE (PF) 100 MCG/2ML IJ SOLN
25.0000 ug | INTRAMUSCULAR | Status: DC | PRN
  Filled 2017-08-07: qty 1

## 2017-08-07 MED ORDER — SODIUM CHLORIDE 0.9 % IJ SOLN
INTRAMUSCULAR | Status: DC | PRN
  Administered 2017-08-07: 2 mL

## 2017-08-07 MED ORDER — BUPIVACAINE-EPINEPHRINE 0.25% -1:200000 IJ SOLN
INTRAMUSCULAR | Status: DC | PRN
  Administered 2017-08-07: 30 mL

## 2017-08-07 MED ORDER — TRAMADOL HCL 50 MG PO TABS: 50.0000 mg | ORAL_TABLET | Freq: Four times a day (QID) | ORAL | 0 refills | Status: AC | PRN

## 2017-08-07 MED ORDER — MIDAZOLAM HCL 2 MG/2ML IJ SOLN
INTRAMUSCULAR | Status: AC
  Filled 2017-08-07: qty 2

## 2017-08-07 MED ORDER — LIDOCAINE 2% (20 MG/ML) 5 ML SYRINGE
INTRAMUSCULAR | Status: DC | PRN
  Administered 2017-08-07: 100 mg via INTRAVENOUS

## 2017-08-07 MED ORDER — CELECOXIB 200 MG PO CAPS
ORAL_CAPSULE | ORAL | Status: AC
  Filled 2017-08-07: qty 1

## 2017-08-07 MED ORDER — MEPERIDINE HCL 25 MG/ML IJ SOLN
6.2500 mg | INTRAMUSCULAR | Status: DC | PRN
  Filled 2017-08-07: qty 1

## 2017-08-07 MED ORDER — ACETAMINOPHEN 500 MG PO TABS
1000.0000 mg | ORAL_TABLET | ORAL | Status: AC
  Administered 2017-08-07: 1000 mg via ORAL
  Filled 2017-08-07: qty 2

## 2017-08-07 MED ORDER — MIDAZOLAM HCL 2 MG/2ML IJ SOLN
0.5000 mg | Freq: Once | INTRAMUSCULAR | Status: DC | PRN
  Filled 2017-08-07: qty 2

## 2017-08-07 MED ORDER — ACETAMINOPHEN 500 MG PO TABS
ORAL_TABLET | ORAL | Status: AC
  Filled 2017-08-07: qty 2

## 2017-08-07 MED ORDER — BUPIVACAINE LIPOSOME 1.3 % IJ SUSP
INTRAMUSCULAR | Status: DC | PRN
  Administered 2017-08-07: 20 mL

## 2017-08-07 MED ORDER — PROMETHAZINE HCL 25 MG/ML IJ SOLN
6.2500 mg | INTRAMUSCULAR | Status: DC | PRN
Start: 2017-08-07 — End: 2017-08-07
  Filled 2017-08-07: qty 1

## 2017-08-07 MED ORDER — ONABOTULINUMTOXINA 100 UNITS IJ SOLR
INTRAMUSCULAR | Status: DC | PRN
  Administered 2017-08-07: 100 [IU] via INTRAMUSCULAR

## 2017-08-07 MED ORDER — LACTATED RINGERS IV SOLN
INTRAVENOUS | Status: DC
  Administered 2017-08-07 (×2): via INTRAVENOUS
  Filled 2017-08-07: qty 1000

## 2017-08-07 MED ORDER — ARTIFICIAL TEARS OPHTHALMIC OINT
TOPICAL_OINTMENT | OPHTHALMIC | Status: AC
  Filled 2017-08-07: qty 3.5

## 2017-08-07 MED ORDER — FENTANYL CITRATE (PF) 100 MCG/2ML IJ SOLN
INTRAMUSCULAR | Status: AC
  Filled 2017-08-07: qty 2

## 2017-08-07 MED ORDER — BUPIVACAINE LIPOSOME 1.3 % IJ SUSP
20.0000 mL | Freq: Once | INTRAMUSCULAR | Status: DC
  Filled 2017-08-07: qty 20

## 2017-08-07 MED ORDER — SCOPOLAMINE 1 MG/3DAYS TD PT72
MEDICATED_PATCH | TRANSDERMAL | Status: AC
  Filled 2017-08-07: qty 1

## 2017-08-07 SURGICAL SUPPLY — 37 items
APL SKNCLS STERI-STRIP NONHPOA (GAUZE/BANDAGES/DRESSINGS) ×2
BENZOIN TINCTURE PRP APPL 2/3 (GAUZE/BANDAGES/DRESSINGS) ×4 IMPLANT
BLADE HEX COATED 2.75 (ELECTRODE) ×2 IMPLANT
BLADE SURG 15 STRL LF DISP TIS (BLADE) IMPLANT
BLADE SURG 15 STRL SS (BLADE) ×2
BRIEF STRETCH FOR OB PAD LRG (UNDERPADS AND DIAPERS) ×4 IMPLANT
COVER BACK TABLE 60X90IN (DRAPES) ×2 IMPLANT
COVER MAYO STAND STRL (DRAPES) ×2 IMPLANT
DRAPE LAPAROTOMY 100X72 PEDS (DRAPES) ×1 IMPLANT
DRAPE UTILITY XL STRL (DRAPES) ×2 IMPLANT
GAUZE SPONGE 4X4 12PLY STRL LF (GAUZE/BANDAGES/DRESSINGS) ×2 IMPLANT
GLOVE BIO SURGEON STRL SZ7 (GLOVE) ×1 IMPLANT
GLOVE BIO SURGEON STRL SZ7.5 (GLOVE) ×2 IMPLANT
GLOVE BIOGEL PI IND STRL 7.5 (GLOVE) IMPLANT
GLOVE BIOGEL PI INDICATOR 7.5 (GLOVE) ×2
GLOVE INDICATOR 8.0 STRL GRN (GLOVE) ×2 IMPLANT
GOWN STRL REUS W/TWL LRG LVL3 (GOWN DISPOSABLE) ×3 IMPLANT
HYDROGEN PEROXIDE 16OZ (MISCELLANEOUS) ×1 IMPLANT
KIT TURNOVER CYSTO (KITS) ×2 IMPLANT
NDL SAFETY ECLIPSE 18X1.5 (NEEDLE) IMPLANT
NEEDLE HYPO 18GX1.5 SHARP (NEEDLE)
NEEDLE HYPO 22GX1.5 SAFETY (NEEDLE) ×3 IMPLANT
NS IRRIG 500ML POUR BTL (IV SOLUTION) ×2 IMPLANT
PACK BASIN DAY SURGERY FS (CUSTOM PROCEDURE TRAY) ×2 IMPLANT
PAD ABD 8X10 STRL (GAUZE/BANDAGES/DRESSINGS) ×2 IMPLANT
PAD ARMBOARD 7.5X6 YLW CONV (MISCELLANEOUS) IMPLANT
SPONGE HEMORRHOID 8X3CM (HEMOSTASIS) IMPLANT
SUCTION FRAZIER HANDLE 10FR (MISCELLANEOUS)
SUCTION TUBE FRAZIER 10FR DISP (MISCELLANEOUS) IMPLANT
SUT MNCRL AB 4-0 PS2 18 (SUTURE) ×1 IMPLANT
SUT VIC AB 3-0 SH 27 (SUTURE) ×2
SUT VIC AB 3-0 SH 27X BRD (SUTURE) IMPLANT
SYR CONTROL 10ML LL (SYRINGE) ×3 IMPLANT
TOWEL OR 17X24 6PK STRL BLUE (TOWEL DISPOSABLE) ×2 IMPLANT
TRAY DSU PREP LF (CUSTOM PROCEDURE TRAY) ×2 IMPLANT
TUBE CONNECTING 12X1/4 (SUCTIONS) ×2 IMPLANT
YANKAUER SUCT BULB TIP NO VENT (SUCTIONS) ×2 IMPLANT

## 2017-08-07 NOTE — Discharge Instructions (Addendum)
ANORECTAL SURGERY: POST OP INSTRUCTIONS  1. DIET: Follow a light bland diet the first 24 hours after arrival home, such as soup, liquids, crackers, etc.  Be sure to include lots of fluids daily.  Avoid fast food or heavy meals as your are more likely to get nauseated.  Eat a low fat diet the next few days after surgery.    2. Take your usually prescribed home medications unless otherwise directed.  3. PAIN CONTROL: a. It is helpful to take an over-the-counter pain medication regularly for the first few days/weeks.  Choose from the following that works best for you: i. Ibuprofen (Advil, etc) Three 200mg  tabs every 6 hours as needed. ii. Acetaminophen (Tylenol, etc) 500-650mg  every 6 hours as needed iii. NOTE: You may take both of these medications together - most patients find it most helpful when alternating between the two (i.e. Ibuprofen at 6am, tylenol at 9am, ibuprofen at 12pm ...) b. A  prescription for pain medication may have been prescribed for you at discharge.  Take your pain medication as prescribed.  i. If you are having problems/concerns with the prescription medicine, please call us for further advice. ii. Do not take the pain medication within 24hrs of alcohol or your previously prescribed Xanax medication as these will make you over sedated and can be life threatening  4. Avoid getting constipated.  Between the surgery and the pain medications, it is common to experience some constipation.  Increasing fluid intake (64oz of water per day) and taking a fiber supplement (such as Metamucil, Citrucel, FiberCon) 1-2 times a day regularly will usually help prevent this problem from occurring.  Take Miralax (over the counter) 1-2x/day while taking a narcotic pain medication. If no bowel movement after 48hours, you may additionally take a laxative like a bottle of Milk of Magnesia which can be purchased over the counter. Avoid enemas if possible as these are often painful.   5. Watch out for  diarrhea.  If you have many loose bowel movements, simplify your diet to bland foods.  Stop any stool softeners and decrease your fiber supplement. If this worsens or does not improve, please call us.  6. Wash / shower every day.  If you were discharged with a dressing, you may remove this the day after your surgery. You may shower normally, getting soap/water on your wound, particularly after bowel movements.  7. Soaking in a warm bath filled a couple inches ("Sitz bath") is a great way to clean the area after a bowel movement and many patients find it is a way to soothe the area.  8. ACTIVITIES as tolerated:   a. You may resume regular (light) daily activities beginning the next day--such as daily self-care, walking, climbing stairs--gradually increasing activities as tolerated.  If you can walk 30 minutes without difficulty, it is safe to try more intense activity such as jogging, treadmill, bicycling, low-impact aerobics, etc. b. Refrain from any heavy lifting or straining for the first 2 weeks after your procedure, particularly if your surgery was for hemorrhoids. c. Avoid activities that make your pain worse d. You may drive when you are no longer taking prescription pain medication, you can comfortably wear a seatbelt, and you can safely maneuver your car and apply brakes.  9. FOLLOW UP in our office a. Please call [facility] at [phone] to set up an appointment to see your surgeon in the office for a follow-up appointment approximately 2 weeks after your surgery. b. Make sure that you call  for this appointment the day you arrive home to insure a convenient appointment time.  9. If you have disability or family leave forms that need to be completed, you may have them completed by your primary care physician's office; for return to work instructions, please ask our office staff and they will be happy to assist you in obtaining this documentation   When to call us [phone]: 1. Poor pain  control 2. Reactions / problems with new medications (rash/itching, etc)  3. Fever over 101.5 F (38.5 C) 4. Inability to urinate 5. Nausea/vomiting 6. Worsening swelling or bruising 7. Continued bleeding from incision. 8. Increased pain, redness, or drainage from the incision  The clinic staff is available to answer your questions during regular business hours (8:30am-5pm).  Please dont hesitate to call and ask to speak to one of our nurses for clinical concerns.   A surgeon from [facility] is always on call at the hospitals   If you have a medical emergency, go to the nearest emergency room or call 911.   [facility] [address], [address], [place], [place]  [address] ? MAIN: [phone] FAX [phone] [email or web address]  Next dose Tylenol after 1:30 pm as needed Next dose Motrin after 3:30 pm as needed   Post Anesthesia Home Care Instructions  Activity: Get plenty of rest for the remainder of the day. A responsible individual must stay with you for 24 hours following the procedure.  For the next 24 hours, DO NOT: -Drive a car -Advertising copywriter -Drink alcoholic beverages -Take any medication unless instructed by your physician -Make any legal decisions or sign important papers.  Meals: Start with liquid foods such as gelatin or soup. Progress to regular foods as tolerated. Avoid greasy, spicy, heavy foods. If nausea and/or vomiting occur, drink only clear liquids until the nausea and/or vomiting subsides. Call your physician if vomiting continues.  Special Instructions/Symptoms: Your throat may feel dry or sore from the anesthesia or the breathing tube placed in your throat during surgery. If this causes discomfort, gargle with warm salt water. The discomfort should disappear within 24 hours.  If you had a scopolamine patch placed behind your ear for the management of post- operative nausea and/or vomiting:  1. The  medication in the patch is effective for 72 hours, after which it should be removed.  Wrap patch in a tissue and discard in the trash. Wash hands thoroughly with soap and water. 2. You may remove the patch earlier than 72 hours if you experience unpleasant side effects which may include dry mouth, dizziness or visual disturbances. 3. Avoid touching the patch. Wash your hands with soap and water after contact with the patch.   Information for Discharge Teaching: EXPAREL (bupivacaine liposome injectable suspension)   Your surgeon gave you EXPAREL(bupivacaine) in your surgical incision to help control your pain after surgery.   EXPAREL is a local anesthetic that provides pain relief by numbing the tissue around the surgical site.  EXPAREL is designed to release pain medication over time and can control pain for up to 72 hours.  Depending on how you respond to EXPAREL, you may require less pain medication during your recovery.  Possible side effects:  Temporary loss of sensation or ability to move in the area where bupivacaine was injected.  Nausea, vomiting, constipation  Rarely, numbness and tingling in your mouth or lips, lightheadedness, or anxiety may occur.  Call your doctor right away if you think  you may be experiencing any of these sensations, or if you have other questions regarding possible side effects.  Follow all other discharge instructions given to you by your surgeon or nurse. Eat a healthy diet and drink plenty of water or other fluids.  If you return to the hospital for any reason within 96 hours following the administration of EXPAREL, please inform your health care providers.

## 2017-08-07 NOTE — Anesthesia Preprocedure Evaluation (Addendum)
Anesthesia Evaluation  Patient identified by MRN, date of birth, ID band Patient awake    Reviewed: Allergy & Precautions, NPO status , Patient's Chart, lab work & pertinent test results  History of Anesthesia Complications Negative for: history of anesthetic complications  Airway Mallampati: I  TM Distance: >3 FB Neck ROM: Full    Dental  (+) Dental Advisory Given   Pulmonary  Seasonal allergies   breath sounds clear to auscultation       Cardiovascular negative cardio ROS   Rhythm:Regular Rate:Normal     Neuro/Psych H/o Bell's palsy    GI/Hepatic negative GI ROS, Neg liver ROS,   Endo/Other  negative endocrine ROS  Renal/GU negative Renal ROS     Musculoskeletal   Abdominal   Peds  Hematology negative hematology ROS (+)   Anesthesia Other Findings   Reproductive/Obstetrics S/p BTL                            Anesthesia Physical Anesthesia Plan  ASA: I  Anesthesia Plan: General   Post-op Pain Management:    Induction: Intravenous  PONV Risk Score and Plan: 3 and Ondansetron, Dexamethasone and Scopolamine patch - Pre-op  Airway Management Planned: LMA  Additional Equipment:   Intra-op Plan:   Post-operative Plan:   Informed Consent: I have reviewed the patients History and Physical, chart, labs and discussed the procedure including the risks, benefits and alternatives for the proposed anesthesia with the patient or authorized representative who has indicated his/her understanding and acceptance.   Dental advisory given  Plan Discussed with: CRNA and Surgeon  Anesthesia Plan Comments: (Plan routine monitors, GA- LMA OK)        Anesthesia Quick Evaluation

## 2017-08-07 NOTE — H&P (Signed)
H&P Update  H&P date 07/30/17 was reviewed by myself with the patient. She reports no interval change in her health or medications. She is feeling well today and ready for the procedure.  Vitals:   07/31/17 1623 08/07/17 0907 08/07/17 0927  BP:  124/82   Pulse:  75   Resp:  16   Temp:  98.1 F (36.7 C)   TempSrc:  Oral   SpO2:  100%   Weight: 83.5 kg (184 lb) 85.8 kg (189 lb 3.2 oz)   Height: 5\' 9"  (1.753 m)  5\' 9"  (1.753 m)  NAD, comfortable RRR Abd soft, NT/ND  A/P [name] is a pleasant 42yoF with hx HLD here today for EUA, injection of botox into internal anal sphincter and excision of anal skin tags  -The anatomy and physiology of the anal canal was again discussed at length with the patient. The pathophysiology of anal fissures was discussed at length with associated pictures and illustrations. -The planned procedure, material risks (including, but not limited to, pain, bleeding, infection, scarring, damage to anal sphincter, incontinence of gas and/or stool, need for additional procedures, recurrence, pneumonia, heart attack, stroke, death) benefits and alternatives to surgery were discussed at length. I noted a good probability that the procedure would help improve her symptoms. The patient's questions were answered to her satisfaction, she voiced understanding and elected to proceed with surgery. Additionally, we discussed typical postoperative expectations and the recovery process.  Stephanie Couphristopher M. Cliffton AstersWhite, M.D. General and Colorectal Surgery [facility]

## 2017-08-07 NOTE — Op Note (Signed)
08/07/2017  10:59 AM  PATIENT:  Veronica Villanueva  42 y.o. female  Patient Care Team: Maurice Small, MD as PCP - General (Family Medicine)  PRE-OPERATIVE DIAGNOSIS:  Chronic anal fissure  POST-OPERATIVE DIAGNOSIS:  * No post-op diagnosis entered *  PROCEDURE:   1. Anal exam under anesthesia/anoscopy 2. Injection of botox into internal anal sphincter 3. Excision of anterior anal canal nodule 4. Excision of perianal skin tags  SURGEON:  Surgeon(s): Andria Meuse, MD  ASSISTANT: Scrub nurse  ANESTHESIA:   local and general  SPECIMEN:   1. Anterior anal canal nodule 2. Perianal skin nodules (skin tags)  DISPOSITION OF SPECIMEN:  PATHOLOGY  COUNTS: Sponge needle and instrument counts were reported correct x2  PLAN OF CARE: Discharge to home after PACU  PATIENT DISPOSITION:  PACU - hemodynamically stable.  INDICATION: [name] is a very pleasant 48-year-old female with history of hyperlipidemia. A for evaluation of anal pain. She has been seen and treated by Dr. Myrtie Neither. She reports history of anal pain that dates back to January of this year. The pain occurs with each bowel movement and sharp/knifelike. She denies any bleeding associated with her bowel movements including bright red blood in the toilet bowl or on the toilet paper. She has been taking MiraLAX and applying topical nitroglycerin cream for 6 months. She reports improvement but failure to completely resolve with regard to her symptoms. They have plateaued in the last 2 months.  Of note, she does report having had a prior fissure surgery 20 years ago for an anal fissure in [place] but is unsure of what was actually done. She had improvement in her symptoms following surgery with subsequent recurrence 6 months ago  On exam, there was a posterior midline anal fissure with small associated skin tags.  DRE and anoscopy were deferred due to fissure.  Given that her fissure is now chronic and has failed  attempts at medical management, she would benefit from a more definitive procedure.  I therefore offered her exam under anesthesia with injection of Botox in the internal anal sphincter given her prior history of a possible fissure and fissure surgery for this.  She requested that we also remove the perianal skin tags at the time of her procedure.  The plan procedure, material risks, benefits and alternatives were discussed with the patient at length.  Please refer to H&P for details regarding this discussion  OR FINDINGS: Small posterior midline anal fissure.  100 units of Botox diluted in 2 cc of saline was injected into the internal anal sphincter circumferentially.  There was an anterior anal canal nodule which was excised and submitted for pathology.  This nodule appeared to contain some debris such as prior suture material versus staple.  Additional perianal skin tags were excised as she had requested.  DESCRIPTION: The patient was identified in the preoperative holding area and taken to the OR where they were placed on the operating room table. SCDs were placed.  General anesthesia with LMA was induced without difficulty. The patient was then the high lithotomy position.  Pressure points were then checked and all were appropriately padded.  The patient was then prepped and draped in usual sterile fashion.  A surgical timeout was performed indicating the correct patient, procedure, positioning and need for preoperative antibiotics.  A rectal block was performed with a mixture of Exparel and 0.25% Marcaine with epinephrine  Digital rectal exam revealed no palpable abnormalities of the anal canal or distal rectum.  A Hill-Ferguson retractor was inserted in the anal canal and circumferential inspection was notable for a small posterior midline anal fissure.  There was an anterior anal canal nodule.  There were also associated perianal skin tags.  100 units of Botox diluted in 2 cc of injectable saline  was then circumferentially injected into the internal anal sphincter muscle.  The anterior anal canal nodule was excised sharply and passed off the specimen.  The mucosal defect was then closed using a 3-0 Vicryl suture.  There was no bleeding and hemostasis was achieved.  3 additional perianal skin nodules consistent with skin tags were then excised sharply and passed off as specime.  Hemostasis was achieved with electrocautery.  The skin defects were then approximated with 4-0 Monocryl subcuticular suture.  Additional Exparel with Marcaine was then infiltrated into the perianal area.  The area was reexamined and noted to be hemostatic.  The area was washed, dried, and covered with dibucaine ointment.  4 x 4's and an ABD pad was then placed as were mesh underwear.  The patient was then taken out of the lithotomy position, awakened from anesthesia, extubated, and transferred to a stretcher for transport to PACU in satisfactory condition.  DISPOSITION:PACU in satisfactory condition.

## 2017-08-07 NOTE — Anesthesia Postprocedure Evaluation (Signed)
Anesthesia Post Note  Patient: Veronica MewSheena M Villanueva  Procedure(s) Performed: ANAL EXAM UNDER ANESTHESIA ANOSCOPY (N/A Rectum) INJECTION BOTOX INTO INTERNAL SPHINCTER (N/A Rectum) EXCISION OF ANAL SKIN TAG (N/A Rectum)     Patient location during evaluation: PACU Anesthesia Type: General Level of consciousness: awake and alert, oriented and patient cooperative Pain management: pain level controlled Vital Signs Assessment: post-procedure vital signs reviewed and stable Respiratory status: spontaneous breathing, nonlabored ventilation and respiratory function stable Cardiovascular status: blood pressure returned to baseline and stable Postop Assessment: no apparent nausea or vomiting Anesthetic complications: no    Last Vitals:  Vitals:   08/07/17 1130 08/07/17 1145  BP: 137/75 130/77  Pulse: 72 66  Resp: 19 18  Temp:    SpO2: 100% 100%    Last Pain:  Vitals:   08/07/17 1145  TempSrc:   PainSc: 0-No pain                 Preslee Regas,E. Amair Shrout

## 2017-08-07 NOTE — Anesthesia Procedure Notes (Signed)
Procedure Name: LMA Insertion Date/Time: 08/07/2017 10:26 AM Performed by: Jairo BenJackson, [place], MD Pre-anesthesia Checklist: Patient identified, Emergency Drugs available, Suction available and Patient being monitored Patient Re-evaluated:Patient Re-evaluated prior to induction Oxygen Delivery Method: Circle system utilized Preoxygenation: Pre-oxygenation with 100% oxygen Induction Type: IV induction Ventilation: Mask ventilation without difficulty LMA: LMA inserted LMA Size: 4.0 Number of attempts: 1 Airway Equipment and Method: Bite block Placement Confirmation: positive ETCO2 Tube secured with: Tape Dental Injury: Teeth and Oropharynx as per pre-operative assessment

## 2017-08-07 NOTE — Transfer of Care (Signed)
  Last Vitals:  Vitals Value Taken Time  BP 136/67 08/07/2017 11:15 AM  Temp    Pulse 80 08/07/2017 11:17 AM  Resp 17 08/07/2017 11:17 AM  SpO2 100 % 08/07/2017 11:17 AM  Vitals shown include unvalidated device data.  Last Pain:  Vitals:   08/07/17 [id number]  TempSrc: Oral      Patients Stated Pain Goal: 7 (08/07/17 [id number])  Immediate Anesthesia Transfer of Care Note  Patient: Veronica Villanueva  Procedure(s) Performed: Procedure(s) (LRB): ANAL EXAM UNDER ANESTHESIA ANOSCOPY (N/A) INJECTION BOTOX INTO INTERNAL SPHINCTER (N/A) EXCISION OF ANAL SKIN TAG (N/A)  Patient Location: PACU  Anesthesia Type: General  Level of Consciousness: awake, alert  and oriented  Airway & Oxygen Therapy: Patient Spontanous Breathing and Patient connected to nasal cannula oxygen  Post-op Assessment: Report given to PACU RN and Post -op Vital signs reviewed and stable  Post vital signs: Reviewed and stable  Complications: No apparent anesthesia complications

## 2017-08-08 ENCOUNTER — Encounter (HOSPITAL_BASED_OUTPATIENT_CLINIC_OR_DEPARTMENT_OTHER): Payer: Self-pay | Admitting: Surgery

## 2017-08-18 ENCOUNTER — Ambulatory Visit: Payer: 59 | Admitting: Neurology

## 2017-10-08 DIAGNOSIS — G510 Bell's palsy: Secondary | ICD-10-CM | POA: Diagnosis not present

## 2017-10-15 DIAGNOSIS — G510 Bell's palsy: Secondary | ICD-10-CM | POA: Diagnosis not present

## 2017-10-23 DIAGNOSIS — G510 Bell's palsy: Secondary | ICD-10-CM | POA: Diagnosis not present

## 2017-11-04 DIAGNOSIS — G510 Bell's palsy: Secondary | ICD-10-CM | POA: Diagnosis not present

## 2017-11-05 DIAGNOSIS — K601 Chronic anal fissure: Secondary | ICD-10-CM | POA: Diagnosis not present

## 2017-11-05 DIAGNOSIS — K644 Residual hemorrhoidal skin tags: Secondary | ICD-10-CM | POA: Diagnosis not present

## 2017-11-19 DIAGNOSIS — G510 Bell's palsy: Secondary | ICD-10-CM | POA: Diagnosis not present

## 2017-12-03 DIAGNOSIS — G510 Bell's palsy: Secondary | ICD-10-CM | POA: Diagnosis not present

## 2018-01-02 DIAGNOSIS — N898 Other specified noninflammatory disorders of vagina: Secondary | ICD-10-CM | POA: Diagnosis not present

## 2018-01-05 DIAGNOSIS — G510 Bell's palsy: Secondary | ICD-10-CM | POA: Diagnosis not present

## 2018-02-16 DIAGNOSIS — G510 Bell's palsy: Secondary | ICD-10-CM | POA: Diagnosis not present

## 2018-02-23 DIAGNOSIS — Z131 Encounter for screening for diabetes mellitus: Secondary | ICD-10-CM | POA: Diagnosis not present

## 2018-02-23 DIAGNOSIS — R002 Palpitations: Secondary | ICD-10-CM | POA: Diagnosis not present

## 2018-02-23 DIAGNOSIS — Z8249 Family history of ischemic heart disease and other diseases of the circulatory system: Secondary | ICD-10-CM | POA: Diagnosis not present

## 2018-03-31 ENCOUNTER — Ambulatory Visit: Payer: 59 | Admitting: Cardiovascular Disease

## 2018-03-31 ENCOUNTER — Encounter

## 2018-06-26 ENCOUNTER — Telehealth: Payer: Self-pay

## 2018-06-26 NOTE — Telephone Encounter (Signed)
Refilled as directed.  

## 2018-06-26 NOTE — Telephone Encounter (Signed)
Incoming fax request from [facility] for [employer] ointment 0.125mg  pea sized amout as needed for fissure. Contacted Anesa, she said she uses as needed for mild flare ups. She is terrified to have things get out of control like before and have to have a repeat surgery. She saw Dr Cliffton Asters last year and has been doing well. She has no current complaints.  However like to have the Nirto on hand for a as needed bases.

## 2018-06-26 NOTE — Telephone Encounter (Signed)
That's fine - thanks for checking in with her.  I signed the refill to be faxed back to [facility].

## 2018-12-10 IMAGING — CT CT HEAD W/O CM
3 of 4 series · 16 of 47 positions shown, 19 images · non-contrast
Comparison: CT brain scan of 09/16/2012 and MR brain of 10/30/2012

CLINICAL DATA: Syncope, numbness, headache, dizziness

EXAM:
CT HEAD WITHOUT CONTRAST
TECHNIQUE: Contiguous axial images were obtained from the base of the skull
through the vertex without intravenous contrast.

[Series 32: 3d filtered head w/o · axial · non-contrast · 0.49mm/px · z∈[+2,+137]mm · 10 of 33 slices shown, 13 images]
[im 3/33  brain]
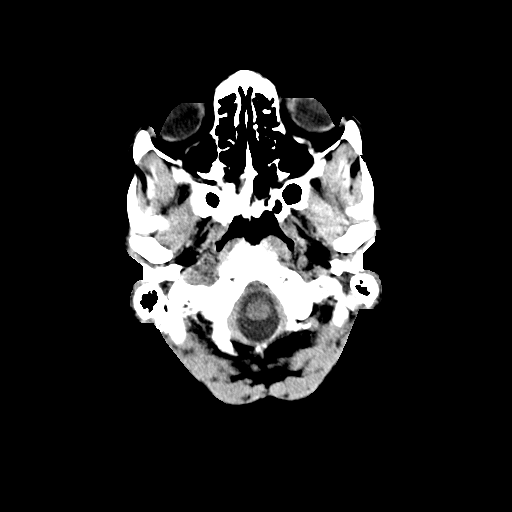
[im 3/33  bone]
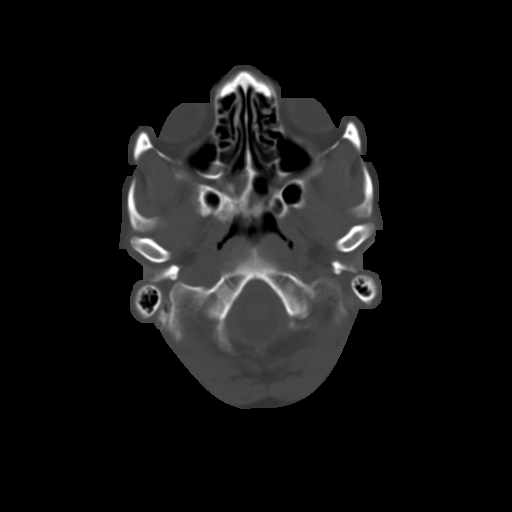
[im 5/33  brain]
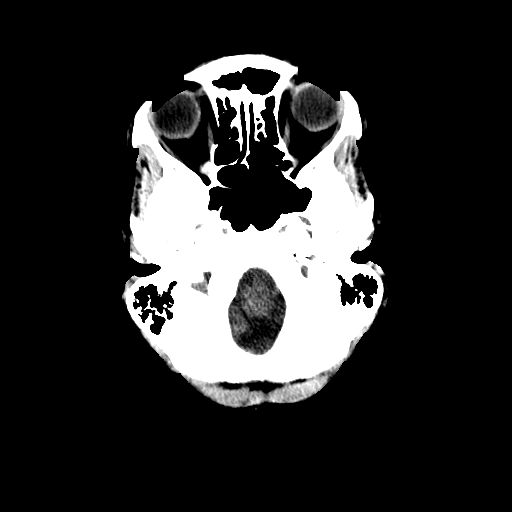
[im 10/33  brain]
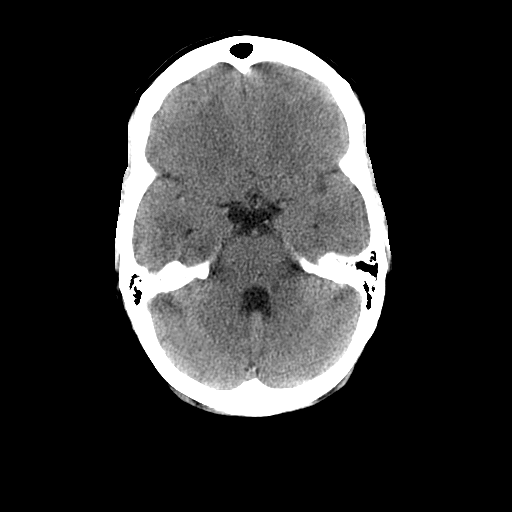
[im 12/33  brain]
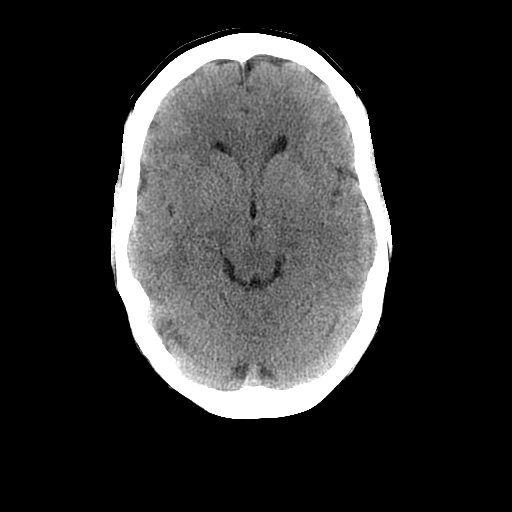
[im 14/33  brain]
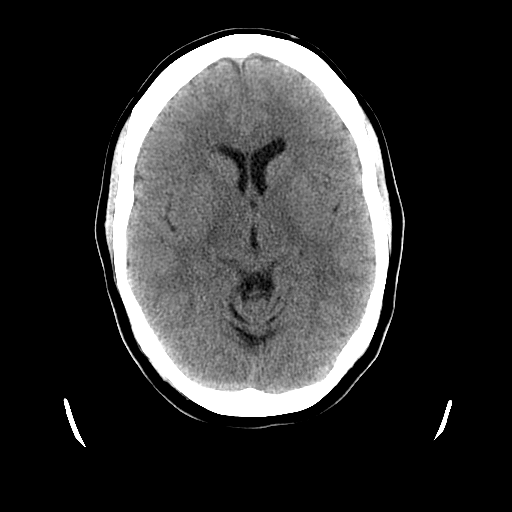
[im 14/33  bone]
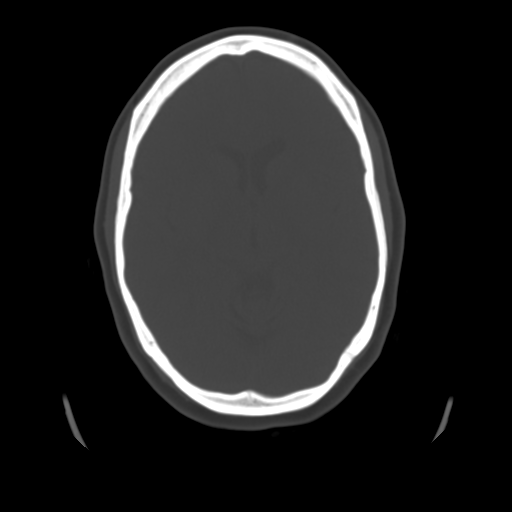
[im 19/33  brain]
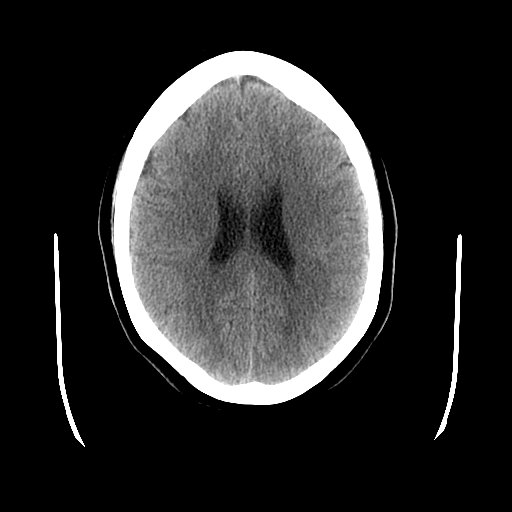
[im 21/33  brain]
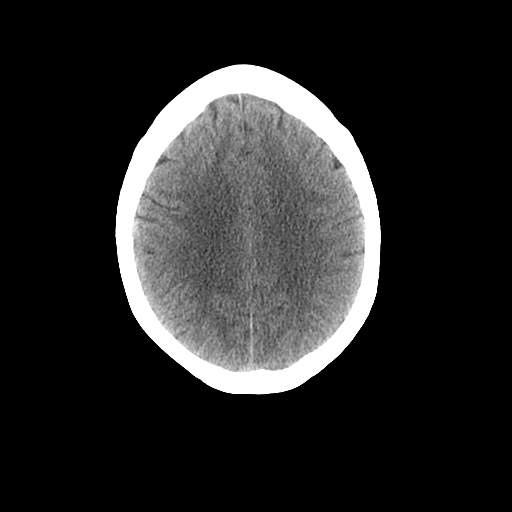
[im 23/33  brain]
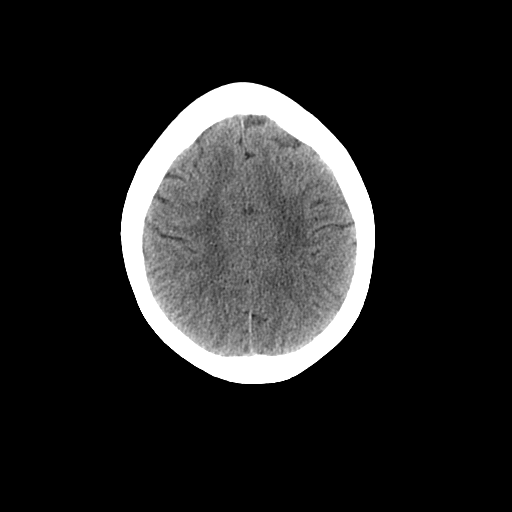
[im 28/33  brain]
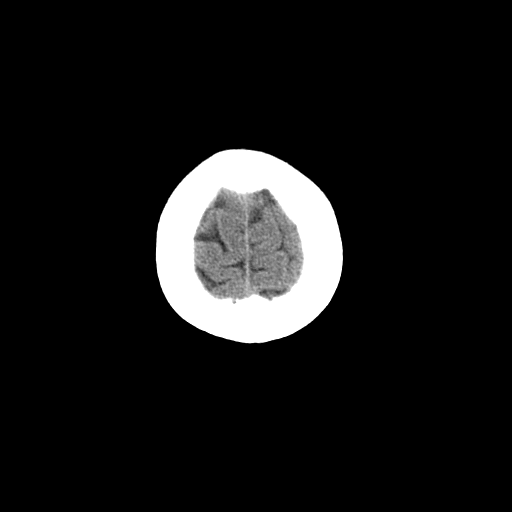
[im 28/33  bone]
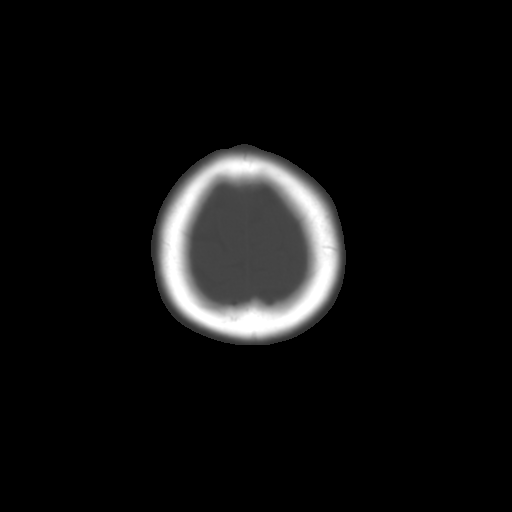
[im 30/33  brain]
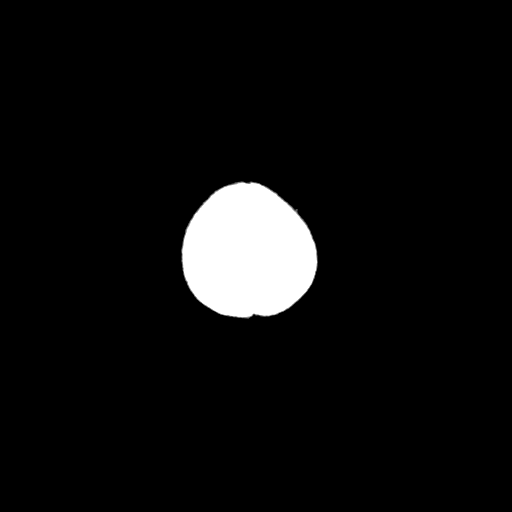

[Series 601: coronal brain · coronal · 0.49mm/px · 3 of 68 slices shown]
[im 23/68  brain]
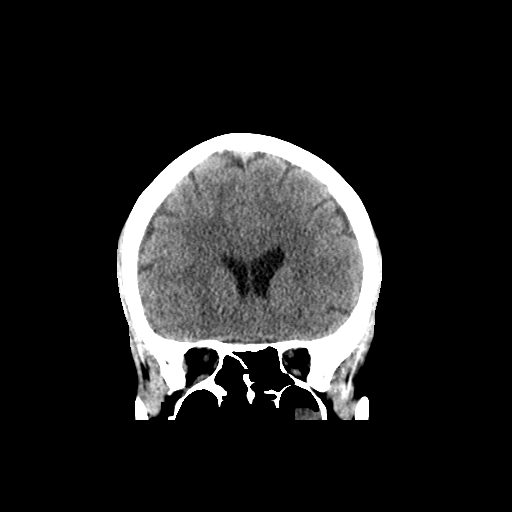
[im 30/68  brain]
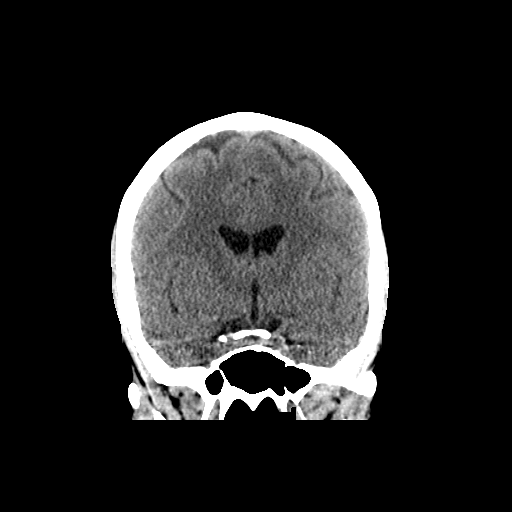
[im 38/68  brain]
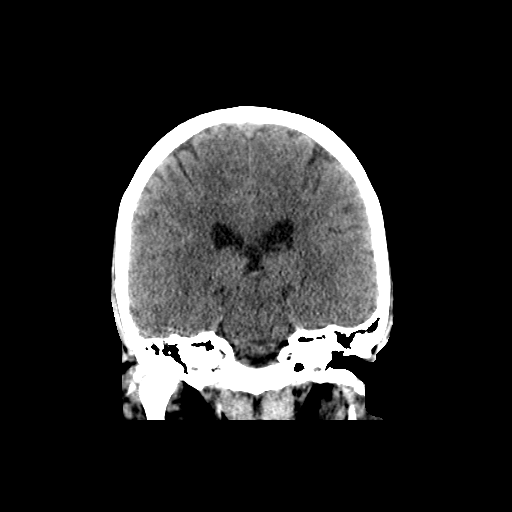

[Series 602: sagittal brain · sagittal · 0.49mm/px · 3 of 54 slices shown]
[im 18/54  brain]
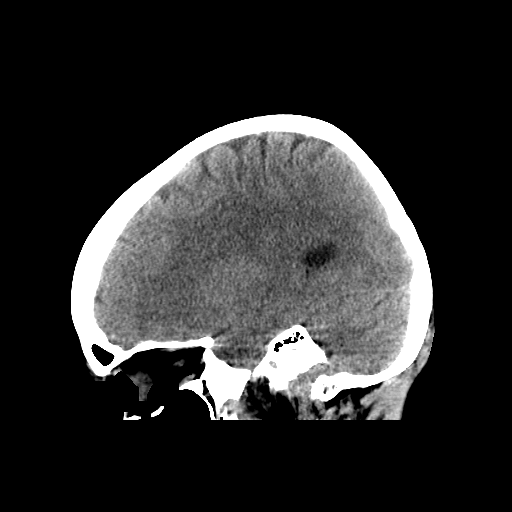
[im 27/54  brain]
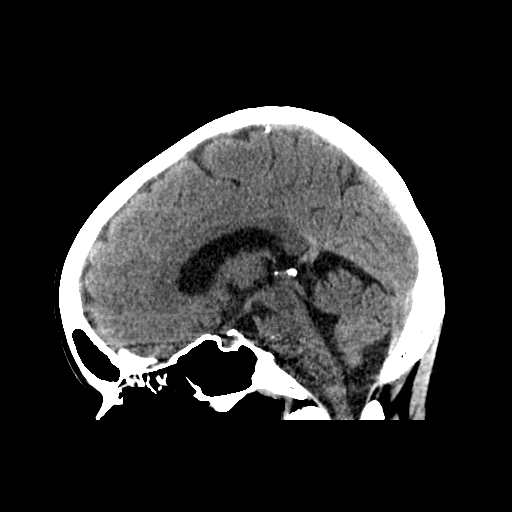
[im 36/54  brain]
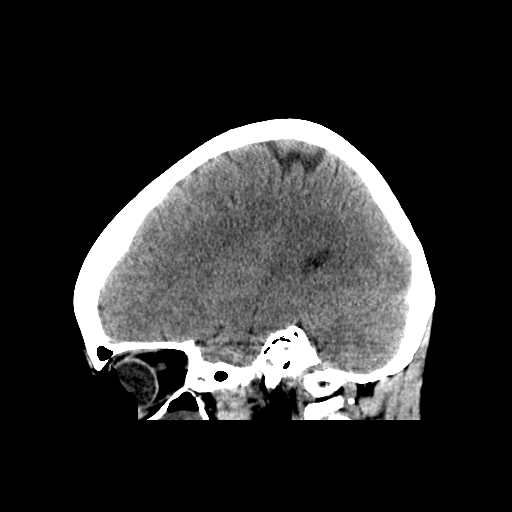

[16 of 47 positions shown; findings below may reference images not displayed]

FINDINGS: Brain: The ventricular system is stable in size and configuration,
and the septum is midline in position. The fourth ventricle and
basilar cisterns appear normal. No hemorrhage, mass lesion, or acute
infarction is seen.

Vascular: No vascular abnormality is seen on this unenhanced study.

Skull: On bone window images, no calvarial abnormality is seen.

Sinuses/Orbits: No sinusitis is noted. However there does appear to
be a retention cyst in the floor of the left maxillary sinus.

Other: None.
IMPRESSION: 1. Negative unenhanced CT of the brain.  No acute abnormality.
2. Probable retention cyst in the floor of the left maxillary sinus
.

## 2019-10-18 ENCOUNTER — Telehealth: Payer: Self-pay | Admitting: Gastroenterology

## 2019-10-18 NOTE — Telephone Encounter (Signed)
Patient called has questions on the compound medication

## 2019-10-18 NOTE — Telephone Encounter (Signed)
Patient requesting a refill on Nitro ointment for chronic fissure.  Last seen 07-2017. Please advise on refill request

## 2019-10-19 MED ORDER — AMBULATORY NON FORMULARY MEDICATION: 1 refills | Status: DC

## 2019-10-19 NOTE — Telephone Encounter (Signed)
Kristle states she is doing well. She is requesting the Nitro refill because she like to have it on hand for a PRN basis. The Nitro expires faster than she can use it. Her most frequent problems are usually around her menstrual cycle, the flares are minor and just like to have it, just incase scenarios.

## 2019-10-19 NOTE — Telephone Encounter (Signed)
It can be refilled, but presumably she is having anal fissure symptoms again (previously had a severe fissure requiring surgical attention). Please contact her because I would like to know what is happening with her.

## 2019-10-19 NOTE — Telephone Encounter (Signed)
Refills have been faxed to [facility]

## 2019-10-19 NOTE — Telephone Encounter (Signed)
Understood - thanks for checking.  Yes, please refill the NTG ointment for her.  - HD

## 2019-12-21 ENCOUNTER — Ambulatory Visit (INDEPENDENT_AMBULATORY_CARE_PROVIDER_SITE_OTHER): Payer: 59 | Admitting: Psychology

## 2019-12-21 DIAGNOSIS — F4321 Adjustment disorder with depressed mood: Secondary | ICD-10-CM | POA: Diagnosis not present

## 2019-12-30 ENCOUNTER — Ambulatory Visit (INDEPENDENT_AMBULATORY_CARE_PROVIDER_SITE_OTHER): Payer: 59 | Admitting: Psychology

## 2019-12-30 DIAGNOSIS — F4321 Adjustment disorder with depressed mood: Secondary | ICD-10-CM | POA: Diagnosis not present

## 2020-01-03 ENCOUNTER — Telehealth: Payer: Self-pay | Admitting: Neurology

## 2020-01-03 NOTE — Telephone Encounter (Signed)
Patient called into get a appt with Dr Everlena Cooper for her Bell Palsy. She is having the Bell Palsy more often and she is also asking the same questions over and over. He last saw her for the Bell palsy on 03-21-17. He is booked out until May of 2022. Please call patient she needs to know what do

## 2020-01-03 NOTE — Telephone Encounter (Signed)
Unfortunately, I have been filling my urgent slots with emergencies, so there isn't any availability in the near future.  Recommend waitlist.

## 2020-01-04 NOTE — Telephone Encounter (Signed)
Per dR. Jaffe please schedule pt and add to the wait list.

## 2020-01-04 NOTE — Telephone Encounter (Signed)
Pt is on the wait list  ?

## 2020-01-11 ENCOUNTER — Ambulatory Visit (INDEPENDENT_AMBULATORY_CARE_PROVIDER_SITE_OTHER): Payer: 59 | Admitting: Psychology

## 2020-01-11 DIAGNOSIS — F4321 Adjustment disorder with depressed mood: Secondary | ICD-10-CM | POA: Diagnosis not present

## 2020-01-18 ENCOUNTER — Ambulatory Visit: Payer: 59 | Admitting: Psychology

## 2020-02-01 ENCOUNTER — Ambulatory Visit: Payer: 59 | Admitting: Psychology

## 2020-02-15 ENCOUNTER — Ambulatory Visit (INDEPENDENT_AMBULATORY_CARE_PROVIDER_SITE_OTHER): Payer: 59 | Admitting: Psychology

## 2020-02-15 DIAGNOSIS — F4321 Adjustment disorder with depressed mood: Secondary | ICD-10-CM | POA: Diagnosis not present

## 2020-02-29 ENCOUNTER — Ambulatory Visit (INDEPENDENT_AMBULATORY_CARE_PROVIDER_SITE_OTHER): Payer: 59 | Admitting: Psychology

## 2020-02-29 DIAGNOSIS — F4321 Adjustment disorder with depressed mood: Secondary | ICD-10-CM | POA: Diagnosis not present

## 2020-03-14 ENCOUNTER — Ambulatory Visit: Payer: 59 | Admitting: Psychology

## 2020-03-28 ENCOUNTER — Ambulatory Visit: Payer: 59 | Admitting: Psychology

## 2020-04-11 ENCOUNTER — Ambulatory Visit: Payer: 59 | Admitting: Psychology

## 2020-04-25 ENCOUNTER — Ambulatory Visit: Payer: 59 | Admitting: Psychology

## 2020-05-09 ENCOUNTER — Ambulatory Visit: Payer: 59 | Admitting: Psychology

## 2020-05-23 ENCOUNTER — Ambulatory Visit (INDEPENDENT_AMBULATORY_CARE_PROVIDER_SITE_OTHER): Payer: 59 | Admitting: Psychology

## 2020-05-23 ENCOUNTER — Ambulatory Visit: Payer: 59 | Admitting: Psychology

## 2020-05-23 DIAGNOSIS — F4321 Adjustment disorder with depressed mood: Secondary | ICD-10-CM

## 2020-06-06 ENCOUNTER — Ambulatory Visit: Payer: 59 | Admitting: Psychology

## 2020-06-20 ENCOUNTER — Ambulatory Visit: Payer: 59 | Admitting: Psychology

## 2020-07-04 ENCOUNTER — Ambulatory Visit: Payer: 59 | Admitting: Psychology

## 2020-07-07 ENCOUNTER — Ambulatory Visit (INDEPENDENT_AMBULATORY_CARE_PROVIDER_SITE_OTHER): Payer: Self-pay | Admitting: Plastic Surgery

## 2020-07-07 ENCOUNTER — Encounter: Payer: Self-pay | Admitting: Plastic Surgery

## 2020-07-07 ENCOUNTER — Other Ambulatory Visit: Payer: Self-pay

## 2020-07-07 DIAGNOSIS — Z719 Counseling, unspecified: Secondary | ICD-10-CM

## 2020-07-07 NOTE — Progress Notes (Signed)
The patient has a history of Bell's palsy on the right side of her face.  It happened about 8 years ago.  She has noticed an improvement over the years.  She is never had Botox before.  She is a little self-conscious of about the asymmetry of her smile.  She is interested to see if there is anything that could be done to help even out the symmetry of her face.  She has very few right details of her forehead.  There is a little bit more wrinkling in the crows feet area on the left compared to the right.  When she smiles she has slight asymmetry of her smile with it stronger on the left.  Overall she is a very pretty lady and has done remarkably well and improving her facial animation.  She does go to physical therapy as well.  We talked about her options.  She decided to try a little bit of Botox just to see if she can soften the difference around her eyes.  8 units of Botox was utilized.  She had 6 placed at the crows feet area and 3 injections total and 2 units at the glabella to try to prevent too much elevation of that brow.  We will do a telemetry visit in 2 weeks and see how she does.  LOT: [id number] [id number] EXP:5/24

## 2020-07-10 ENCOUNTER — Ambulatory Visit: Payer: 59 | Admitting: Neurology

## 2020-07-18 ENCOUNTER — Ambulatory Visit (INDEPENDENT_AMBULATORY_CARE_PROVIDER_SITE_OTHER): Payer: 59 | Admitting: Psychology

## 2020-07-18 DIAGNOSIS — F4321 Adjustment disorder with depressed mood: Secondary | ICD-10-CM

## 2020-07-25 ENCOUNTER — Encounter: Payer: Self-pay | Admitting: Plastic Surgery

## 2020-07-25 ENCOUNTER — Telehealth (INDEPENDENT_AMBULATORY_CARE_PROVIDER_SITE_OTHER): Payer: 59 | Admitting: Plastic Surgery

## 2020-07-25 DIAGNOSIS — Z719 Counseling, unspecified: Secondary | ICD-10-CM

## 2020-07-25 NOTE — Progress Notes (Signed)
The patient is a 45 year old female joining me by telemetry visit.  She is very pleased with her results.  We will see her back in 3 months.

## 2020-10-04 ENCOUNTER — Encounter: Payer: Self-pay | Admitting: Gastroenterology

## 2020-10-17 ENCOUNTER — Ambulatory Visit (INDEPENDENT_AMBULATORY_CARE_PROVIDER_SITE_OTHER): Payer: Self-pay | Admitting: Plastic Surgery

## 2020-10-17 ENCOUNTER — Encounter: Payer: Self-pay | Admitting: Plastic Surgery

## 2020-10-17 ENCOUNTER — Other Ambulatory Visit: Payer: Self-pay

## 2020-10-17 ENCOUNTER — Ambulatory Visit: Payer: 59 | Admitting: Psychology

## 2020-10-17 DIAGNOSIS — Z719 Counseling, unspecified: Secondary | ICD-10-CM

## 2020-10-17 NOTE — Progress Notes (Signed)
Botulinum Toxin Procedure Note  Procedure: Cosmetic botulinum toxin   Pre-operative Diagnosis: Dynamic rhytides  Post-operative Diagnosis: Same  Complications:  None  Procedure: The area was prepped with alcohol and dried with a clean gauze. Using a clean technique, the botulinum toxin was diluted with 1.25 cc of preservative-free normal saline which was slowly injected with an 18 gauge needle in a tuberculin syringes.  A 32 gauge needles were then used to inject the botulinum toxin. This mixture allow for an aliquot of 5 units per 0.1 cc in each injection site.    A total of 8 Units of botulinum toxin was used. The glabellar area was injected .  3 injections were placed in the left periorbital area.  1 injection was done in the right frontal area but was quite high and nearly in the hairline.  It looks like she has a little ptosis of the right lid with elevation of the brow due to her overall issues.  And then she has slight ptosis of the left brow.  She is very happy with the way it looked last time we did it.  I explained that we may have to alter it a little bit each time as the nerves will get paralysis and may change their behavior sound.  No complications were noted. Light pressure was held for 5 minutes. She was instructed explicitly in post-operative care.  Botox LOT:  [id number] EXP:  2024/7

## 2020-11-24 ENCOUNTER — Ambulatory Visit: Payer: 59 | Admitting: Gastroenterology

## 2020-12-19 ENCOUNTER — Ambulatory Visit: Payer: 59 | Admitting: Gastroenterology

## 2021-01-30 ENCOUNTER — Encounter: Payer: 59 | Admitting: Plastic Surgery

## 2021-02-02 ENCOUNTER — Encounter: Payer: 59 | Admitting: Plastic Surgery

## 2021-04-17 ENCOUNTER — Ambulatory Visit: Payer: 59 | Admitting: Gastroenterology

## 2021-04-23 ENCOUNTER — Ambulatory Visit: Payer: 59 | Admitting: Gastroenterology

## 2021-04-27 ENCOUNTER — Encounter: Payer: Self-pay | Admitting: Gastroenterology

## 2021-04-27 ENCOUNTER — Ambulatory Visit: Payer: 59 | Admitting: Gastroenterology

## 2021-04-27 VITALS — BP 126/82 | HR 74 | Ht 69.5 in | Wt 209.4 lb

## 2021-04-27 DIAGNOSIS — R194 Change in bowel habit: Secondary | ICD-10-CM

## 2021-04-27 MED ORDER — NA SULFATE-K SULFATE-MG SULF 17.5-3.13-1.6 GM/177ML PO SOLN: 1.0000 | Freq: Once | ORAL | 0 refills | Status: AC

## 2021-04-27 NOTE — Patient Instructions (Signed)
You have been scheduled for a colonoscopy. Please follow written instructions given to you at your visit today.  ?Please pick up your prep supplies at the pharmacy within the next 1-3 days. ?If you use inhalers (even only as needed), please bring them with you on the day of your procedure. ? ? ?If you are age 44 or younger, your body mass index should be between 19-25. Your Body mass index is 30.48 kg/m?[name] If this is out of the aformentioned range listed, please consider follow up with your Primary Care Provider.  ? ?________________________________________________________ ? ?The [facility] providers would like to encourage you to use [facility] to communicate with providers for non-urgent requests or questions.  Due to long hold times on the telephone, sending your provider a message by [facility] may be a faster and more efficient way to get a response.  Please allow 48 business hours for a response.  Please remember that this is for non-urgent requests.  ?_______________________________________________________ ? ?

## 2021-05-01 ENCOUNTER — Encounter: Payer: Self-pay | Admitting: Gastroenterology

## 2021-05-01 DIAGNOSIS — R194 Change in bowel habit: Secondary | ICD-10-CM | POA: Insufficient documentation

## 2021-05-01 NOTE — Progress Notes (Signed)
? ? ? ?05/01/2021 ?Veronica Villanueva ?[id number] ?[birth year] ? ? ?HISTORY OF PRESENT ILLNESS: This is a 46 year old female who is a patient of Dr. Corena Pilgrim, not seen here since 2019.  She had been seen here previously for treatment of an anal fissure.  She then was seen by Dr. Dema Severin with colorectal surgery and underwent EUA/injection of Botox for chronic fissure/excision of anal canal nodule and skin tags on 08/07/2017.  She saw him again on March 21, 2021 with complaints of difficulty with complete evacuation of her stools.  He is sending her back here on this occasion to discuss colonoscopy.  He also mentioned anorectal manometry for evaluation of possible pelvic floor dysfunction.  Denies constipation or straining.  She is no longer taking a daily fiber supplement. ? ? ?Past Medical History:  ?Diagnosis Date  ? Chronic anal fissure   ? Chronically dry eyes   ? History of cardiac murmur as a child   ? 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 History of trichomonal vaginitis   ? Hyperlipidemia   ? Pre-diabetes   ? Right-sided Bell's palsy neurologist-  dr Metta Clines  ? dx 07/ 2014-- w/ chronic residual hyperacusis,slight lip droop, eye twitches, intermittant facial pain  ? ?Past Surgical History:  ?Procedure Laterality Date  ? COLD [facility] CERVIX CONIZATION AND CURRECTAGE  08-04-2009   dr Leo Grosser [facility]  ? EXCISION OF SKIN TAG N/A 08/07/2017  ? Procedure: EXCISION OF ANAL SKIN TAG;  Surgeon: Ileana Roup, MD;  Location: [place];  Service: General;  Laterality: N/A;  ? [place]  ? w/ Fistula repair  ? SPHINCTEROTOMY N/A 08/07/2017  ? Procedure: INJECTION BOTOX INTO INTERNAL SPHINCTER;  Surgeon: Ileana Roup, MD;  Location[facility];  Service: General;  Laterality: N/A;  ? TUBAL LIGATION Bilateral 05-13-2008   dr Leo Grosser [facility]  ? PPTL (epidural)  ? [place] EXTRACTION  2004  ? ? reports that she has never  smoked. She has never used smokeless tobacco. She reports current alcohol use. She reports that she does not use drugs. ?family history includes Anal fissures in her mother; Breast cancer in her maternal grandmother; Diabetes in her father, paternal grandfather, and paternal grandmother; Heart disease in her father; Hyperlipidemia in her father; Hypertension in her father and mother; Stomach cancer in her maternal grandmother; Stroke in her paternal grandfather. ?No Known Allergies ? ?  ?Outpatient Encounter Medications as of 04/27/2021  ?Medication Sig  ? ALPRAZolam (XANAX) 0.5 MG tablet Take 0.5 mg by mouth at bedtime as needed for sleep.  ? AMBULATORY NON FORMULARY MEDICATION Nitroglycerine ointment 0.125 %  ?Apply a pea sized amount internally three times daily. ?Dispense 30 GM zero refill  ? cycloSPORINE (RESTASIS) 0.05 % ophthalmic emulsion Apply 1 drop to eye as directed.  ? gabapentin (NEURONTIN) 100 MG capsule Take 1 capsule (100 mg total) by mouth 3 (three) times daily.  ? [EXPIRED] Na Sulfate-K Sulfate-Mg Sulf 17.5-3.13-1.6 GM/177ML SOLN Take 1 kit by mouth once for 1 dose.  ? oxymetazoline (AFRIN) 0.05 % nasal spray Place 1 spray into both nostrils 2 (two) times daily.  ? Probiotic Product (PROBIOTIC FORMULA) CAPS Take 1 capsule by mouth daily.   ? simvastatin (ZOCOR) 40 MG tablet Take 40 mg by mouth at bedtime.   ? ?No facility-administered encounter medications on file as of 04/27/2021.  ? ? ? ?REVIEW OF  SYSTEMS  : All other systems reviewed and negative except where noted in the History of Present Illness. ? ? ?PHYSICAL EXAM: ?BP 126/82   Pulse 74   Ht 5' 9.5" (1.765 m)   Wt 209 lb 6 oz (95 kg)   BMI 30.48 kg/m?  ?General: Well developed female in no acute distress ?Head: Normocephalic and atraumatic ?Eyes:  Sclerae anicteric, conjunctiva pink. ?Ears: Normal auditory acuity ?Lungs: Clear throughout to auscultation; no W/R/R. ?Heart: Regular rate and rhythm; no M/R/G. ?Abdomen: Soft, non-distended.   BS present.  Non-tender. ?Rectal:  Will be done at the time of colonoscopy. ?Musculoskeletal: Symmetrical with no gross deformities  ?Skin: No lesions on visible extremities ?Extremities: No edema  ?Neurological: Alert oriented x 4, grossly non-focal ?Psychological:  Alert and cooperative. Normal mood and affect ? ?ASSESSMENT AND PLAN: ?*Change in bowel habits with sensation of incomplete stool evacuation: Referred here by Dr. White from [facility] to discuss colonoscopy.  He is also been planning for potential anorectal manometry.  We will schedule for colonoscopy with Dr. Danis.  The risks, benefits, and alternatives to colonoscopy were discussed with the patient and she consents to proceed.  ? ? ?CC:  Griffin, [name], MD ? ?  ?

## 2021-05-03 ENCOUNTER — Other Ambulatory Visit: Payer: Self-pay

## 2021-05-08 NOTE — Progress Notes (Signed)
____________________________________________________________  Attending physician addendum:  Thank you for sending this case to me. I have reviewed the entire note and agree with the plan.   Doyce Saling Danis, MD  ____________________________________________________________  

## 2021-05-23 ENCOUNTER — Encounter: Payer: Self-pay | Admitting: Gastroenterology

## 2021-06-04 ENCOUNTER — Encounter: Payer: 59 | Admitting: Gastroenterology

## 2021-06-21 ENCOUNTER — Ambulatory Visit (INDEPENDENT_AMBULATORY_CARE_PROVIDER_SITE_OTHER): Payer: Self-pay | Admitting: Plastic Surgery

## 2021-06-21 DIAGNOSIS — Z411 Encounter for cosmetic surgery: Secondary | ICD-10-CM

## 2021-06-21 NOTE — Progress Notes (Signed)
Patient presents to discuss Botox injection in the left periorbital area.  She has Bell's palsy and relatively less animation on the right side of her face.  At her last visit with [place] she got 8 units of Botox at 3 injection points in the left crows feet to help with symmetry.  She feels that that did a nice job but is wondering if she could get away with 6 units instead of 8.  On exam she has mild static and dynamic lines in the left crows feet more prominent than on the right.  We reviewed the risks and benefits of Botox treatment.  6 units of Botox was drawn up and injected at 3 points in the crows feet area on the left.  She tolerated this fine.  We will plan to see her at her next visit.  All of her questions were answered. ?

## 2021-06-25 ENCOUNTER — Telehealth: Payer: Self-pay | Admitting: Gastroenterology

## 2021-06-25 NOTE — Telephone Encounter (Signed)
Inbound call from patient requesting a call back to discuss the stomach issues she is having. Would not go in detail of the symptoms she is having. Please advise.  ?

## 2021-06-25 NOTE — Telephone Encounter (Signed)
Patient called to follow up on previous message stating no one has called her back she confirmed there was no voicemail left and requested a call back. ?

## 2021-06-25 NOTE — Telephone Encounter (Signed)
Left message on machine to call back  

## 2021-06-26 NOTE — Telephone Encounter (Signed)
I spoke with the pt and she has billing questions she would like answered.  She was given the billing number to call and hopefully get her questions answered.  She was thankful for the information.   ?

## 2021-08-13 ENCOUNTER — Telehealth: Payer: Self-pay | Admitting: Gastroenterology

## 2021-08-13 ENCOUNTER — Encounter: Payer: Self-pay | Admitting: Family

## 2021-08-13 ENCOUNTER — Ambulatory Visit: Payer: 59 | Admitting: Family

## 2021-08-13 ENCOUNTER — Telehealth: Payer: Self-pay

## 2021-08-13 VITALS — BP 130/82 | HR 61 | Temp 98.7°F | Resp 16 | Ht 69.5 in | Wt 202.4 lb

## 2021-08-13 DIAGNOSIS — E782 Mixed hyperlipidemia: Secondary | ICD-10-CM

## 2021-08-13 DIAGNOSIS — M62838 Other muscle spasm: Secondary | ICD-10-CM | POA: Insufficient documentation

## 2021-08-13 DIAGNOSIS — Z8669 Personal history of other diseases of the nervous system and sense organs: Secondary | ICD-10-CM

## 2021-08-13 DIAGNOSIS — R030 Elevated blood-pressure reading, without diagnosis of hypertension: Secondary | ICD-10-CM | POA: Diagnosis not present

## 2021-08-13 DIAGNOSIS — I10 Essential (primary) hypertension: Secondary | ICD-10-CM | POA: Diagnosis not present

## 2021-08-13 MED ORDER — METHOCARBAMOL 500 MG PO TABS: 500.0000 mg | ORAL_TABLET | Freq: Three times a day (TID) | ORAL | Status: DC | PRN

## 2021-08-13 MED ORDER — LOSARTAN POTASSIUM 50 MG PO TABS: 50.0000 mg | ORAL_TABLET | Freq: Every day | ORAL | 1 refills | Status: DC

## 2021-08-13 NOTE — Telephone Encounter (Signed)
Spoke with patient & she did confirm that she would like to cancel rectal manometry. She states insurance will not cover remaining cost of $1800 & would prefer to reschedule next year once insurance gives approval d/t age.

## 2021-08-13 NOTE — Progress Notes (Signed)
New Patient Office Visit  Subjective:  Patient ID: Veronica Villanueva, female    DOB: 1976-01-29  Age: 46 y.o. MRN: [id number]  CC:  Chief Complaint  Patient presents with   Establish Care    HPI [name] is here to establish care as a new patient.  Prior provider was: Maurice Small, MD  Pt is without acute concerns.   chronic concerns:  Anal fissure: seeing GI, on bene-fiber to reduce risk of constipation. Doing well. Regular bowel movements. Colonoscopy next year.   H/o bells palsy, worse during menstrual cycle. Massages once monthly and therapist needles this when needed. Has seen neurologist in the past, was placed on gabapentin, but she didn't want to continue on this. Restasis in bil eyes. Sees opthamologist Dr. Alben Spittle, once yearly. Does take flexeril prn as needed for muscle spasms from bells palsy however reacted to flexeril recently and caused high blood pressure so was switched to robaxin 500 mg prn.   Elevated blood pressure: has noticed that average at home around 135/ 80 No cp palp and or sob. Was in the 140's but has been more in the 130 in the last few weeks.   Anxiety: xanax 0.5 mg when needed. Usually needs about twice monthly.   Prediabetes: 5.9 on 2/23   Hyperlipidemia: last ldl 106 (pt had information on her phone). On simvastatin 40 mg once daily.   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  cold knife cervix conization    History of trichomonal vaginitis    Hyperlipidemia    Pre-diabetes    Right-sided Bell's palsy neurologist-  dr Shon Millet   dx 07/ 2014-- w/ chronic residual hyperacusis,slight lip droop, eye twitches, intermittant facial pain    Past Surgical History:  Procedure Laterality Date   COLD [facility] CERVIX CONIZATION AND CURRECTAGE  08-04-2009   dr Pennie Rushing [facility]   EXCISION OF SKIN TAG N/A  08/07/2017   Procedure: EXCISION OF ANAL SKIN TAG;  Surgeon: Andria Meuse, MD;  Location: [facility];  Service: General;  Laterality: N/A;   HEMORRHOID SURGERY  1998   w/ Fistula repair   SPHINCTEROTOMY N/A 08/07/2017   Procedure: INJECTION BOTOX INTO INTERNAL SPHINCTER;  Surgeon: Andria Meuse, MD;  Location: [facility] [place];  Service: General;  Laterality: N/A;   TUBAL LIGATION Bilateral 05-13-2008   dr Pennie Rushing [facility]   PPTL (epidural)   WISDOM TOOTH EXTRACTION  2004    Family History  Problem Relation Age of Onset   Hypertension Mother    Anal fissures Mother    Hypertension Father    Heart disease Father    Diabetes Father    Hyperlipidemia Father    Stomach cancer Maternal Grandmother    Breast cancer Maternal Grandmother    Diabetes Paternal Grandmother        insulin   Stroke Paternal Grandfather    Diabetes Paternal Grandfather    Colon cancer Neg Hx    Esophageal cancer Neg Hx    Rectal cancer Neg Hx     Social History   Socioeconomic History   Marital status: Married    Spouse name: Rylo   Number of children: 2   Years of education: HS   Highest education level: Not on file  Occupational History   Occupation: Event organiser: [employer]  Tobacco Use   Smoking status: Never   Smokeless tobacco: Never  Vaping Use   Vaping Use: Never used  Substance and Sexual Activity   Alcohol use: Yes    Comment: occasional   Drug use: No   Sexual activity: Yes    Birth control/protection: Surgical    Comment: BTL  Other Topics Concern   Not on file  Social History Narrative   Patient lives at home with family.   Patient consumes 1 cup of caffeine daily coffee/tea.   Exercises regularly, goes to gym.   Social Determinants of Health   Financial Resource Strain: Not on file  Food Insecurity: Not on file  Transportation Needs: Not on file  Physical Activity: Not on file  Stress: Not on file  Social Connections: Not on  file  Intimate Partner Violence: Not on file    Outpatient Medications Prior to Visit  Medication Sig Dispense Refill   ALPRAZolam (XANAX) 0.5 MG tablet Take 0.5 mg by mouth at bedtime as needed for sleep.     Cinnamon 500 MG capsule      cycloSPORINE (RESTASIS) 0.05 % ophthalmic emulsion Apply 1 drop to eye as directed.     Multiple Vitamin (MULTIVITAMIN) capsule Take 1 capsule by mouth daily.     Probiotic Product (PROBIOTIC FORMULA) CAPS Take 1 capsule by mouth daily.      simvastatin (ZOCOR) 40 MG tablet Take 40 mg by mouth at bedtime.      AMBULATORY NON FORMULARY MEDICATION Nitroglycerine ointment 0.125 %  Apply a pea sized amount internally three times daily. Dispense 30 GM zero refill 30 g 1   Bacillus Coagulans-Inulin (PROBIOTIC) 1-250 BILLION-MG CAPS      gabapentin (NEURONTIN) 100 MG capsule Take 1 capsule (100 mg total) by mouth 3 (three) times daily. 90 capsule 5   oxymetazoline (AFRIN) 0.05 % nasal spray Place 1 spray into both nostrils 2 (two) times daily.     cycloSPORINE (RESTASIS) 0.05 % ophthalmic emulsion 1 drop into affected eye     No facility-administered medications prior to visit.    Allergies  Allergen Reactions   Flexeril [Cyclobenzaprine] Other (See Comments)    Elevated blood pressure        Objective:    Physical Exam  Gen: NAD, resting comfortably CV: RRR with no murmurs appreciated Pulm: NWOB, CTAB with no crackles, wheezes, or rhonchi Skin: warm, dry Psych: Normal affect and thought content  BP 130/82   Pulse 61   Temp 98.7 F (37.1 C)   Resp 16   Ht 5' 9.5" (1.765 m)   Wt 202 lb 6 oz (91.8 kg)   LMP 07/30/2021 (Exact Date)   SpO2 99%   BMI 29.46 kg/m  Wt Readings from Last 3 Encounters:  08/13/21 202 lb 6 oz (91.8 kg)  04/27/21 209 lb 6 oz (95 kg)  07/07/20 203 lb (92.1 kg)     Health Maintenance Due  Topic Date Due   PAP SMEAR-Modifier  02/21/2019   COVID-19 Vaccine (3 - Pfizer series) 09/04/2019    There are no  preventive care reminders to display for this patient.  Lab Results  Component Value Date   TSH 1.70 06/09/2006   Lab Results  Component Value Date   WBC 7.8 08/01/2017   HGB 12.7 08/01/2017   HCT 38.5 08/01/2017   MCV 96.5 08/01/2017   PLT 174 08/01/2017   Lab Results  Component Value Date   NA 141 08/01/2017   K 4.1 08/01/2017   CO2 28 08/01/2017   GLUCOSE 80 08/01/2017   BUN 18 08/01/2017   CREATININE 0.79 08/01/2017   BILITOT 0.5 08/01/2017   ALKPHOS 47 08/01/2017   AST 17 08/01/2017   ALT 17 08/01/2017   PROT 7.0 08/01/2017   ALBUMIN 4.0 08/01/2017   CALCIUM 9.1 08/01/2017   ANIONGAP 5 08/01/2017   Lab Results  Component Value Date   CHOL 220 (HH) 06/09/2006   Lab Results  Component Value Date   HDL 54.7 06/09/2006   No results found for: "LDLCALC" Lab Results  Component Value Date   TRIG 92 06/09/2006   Lab Results  Component Value Date   CHOLHDL 4.0 CALC 06/09/2006   No results found for: "HGBA1C"    Assessment & Plan:   Problem List Items Addressed This Visit       Cardiovascular and Mediastinum   Primary hypertension    Long d/w pt in regards to treating.  More than 3 consecutive visits with elevation of blood pressure.  Starting losartan 50 mg once daily.  Pt advised of the following:  Continue medication as prescribed. Monitor blood pressure periodically and/or when you feel symptomatic. Goal is <130/90 on average. Ensure that you have rested for 30 minutes prior to checking your blood pressure. Record your readings and bring them to your next visit if necessary.work on a low sodium diet.        Relevant Medications   losartan (COZAAR) 50 MG tablet     Other   Mixed hyperlipidemia - Primary    Ordered lipid panel, pending results. Work on low cholesterol diet and exercise as tolerated       Relevant Medications   losartan (COZAAR) 50 MG tablet   History of Bell's palsy    Neurology as necessary Continue massage prn        Night muscle spasms    Has robaxin to use prn       Relevant Medications   methocarbamol (ROBAXIN) 500 MG tablet   RESOLVED: Elevated blood pressure reading without diagnosis of hypertension   Relevant Orders   EKG 12-Lead (Completed)    Meds ordered this encounter  Medications   methocarbamol (ROBAXIN) 500 MG tablet    Sig: Take 1 tablet (500 mg total) by mouth every 8 (eight) hours as needed for muscle spasms.    Order Specific Question:   Supervising Provider    Answer:   Ermalene Searing, AMY E [[id number]]   losartan (COZAAR) 50 MG tablet    Sig: Take 1 tablet (50 mg total) by mouth daily.    Dispense:  90 tablet    Refill:  1    Order Specific Question:   Supervising Provider    Answer:   Ermalene Searing, AMY E [[id number]]    Follow-up: Return in about 6 weeks (around 09/24/2021) for with blood pressure log for follow up .    Mort Sawyers, FNP

## 2021-08-14 ENCOUNTER — Encounter: Payer: Self-pay | Admitting: Family

## 2021-08-15 ENCOUNTER — Encounter (HOSPITAL_COMMUNITY): Admission: RE | Payer: Self-pay | Source: Home / Self Care

## 2021-08-15 ENCOUNTER — Ambulatory Visit (HOSPITAL_COMMUNITY): Admission: RE | Admit: 2021-08-15 | Payer: 59 | Source: Home / Self Care | Admitting: Gastroenterology

## 2021-08-15 SURGERY — MANOMETRY, ANORECTAL
Anesthesia: Choice

## 2021-09-03 ENCOUNTER — Encounter: Payer: Self-pay | Admitting: Family

## 2021-09-05 ENCOUNTER — Encounter: Payer: Self-pay | Admitting: Family

## 2021-09-17 ENCOUNTER — Ambulatory Visit: Payer: 59 | Admitting: Family

## 2021-10-02 ENCOUNTER — Encounter: Payer: Self-pay | Admitting: Family

## 2021-10-03 ENCOUNTER — Encounter: Payer: Self-pay | Admitting: Family

## 2021-10-08 NOTE — Telephone Encounter (Signed)
Pt made an appt for 8/31 @7 :40

## 2021-10-18 ENCOUNTER — Encounter: Payer: Self-pay | Admitting: Family

## 2021-10-18 ENCOUNTER — Ambulatory Visit: Payer: 59 | Admitting: Family

## 2021-10-18 VITALS — BP 112/76 | HR 61 | Temp 98.6°F | Resp 16 | Ht 69.5 in | Wt 207.0 lb

## 2021-10-18 DIAGNOSIS — I10 Essential (primary) hypertension: Secondary | ICD-10-CM | POA: Diagnosis not present

## 2021-10-18 DIAGNOSIS — R030 Elevated blood-pressure reading, without diagnosis of hypertension: Secondary | ICD-10-CM

## 2021-10-18 MED ORDER — LOSARTAN POTASSIUM 25 MG PO TABS: 25.0000 mg | ORAL_TABLET | Freq: Every day | ORAL | 0 refills | Status: DC

## 2021-10-18 NOTE — Assessment & Plan Note (Signed)
Continue with losartan 25 mg once daily.  Continue to monitor blood pressure periodically.  Watch low sodium diet.  F/u two months with blood pressure log.

## 2021-10-18 NOTE — Progress Notes (Addendum)
Established Patient Office Visit  Subjective:  Patient ID: Veronica Villanueva, female    DOB: 20-Feb-1975  Age: 46 y.o. MRN: [id number]  CC:  Chief Complaint  Patient presents with   Medication Problem    Bp has got better since she has lower dose and the time of day.    HPI Veronica Villanueva is here today with concerns.   HTN: had been feeling dizzy over a few weeks ago, and I had told her to half the losartan to 25 mg once daily. Since decreasing the dose she is no longer feeling dizziness. She has not yet taken her medication this am. She also has not eaten any food and or drank anything yet this am.   Has been exercising and watching her low cholesterol diet a lot better.   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  cold knife cervix conization    History of trichomonal vaginitis    Hyperlipidemia    Pre-diabetes    Right-sided Bell's palsy neurologist-  dr Shon Millet   dx 07/ 2014-- w/ chronic residual hyperacusis,slight lip droop, eye twitches, intermittant facial pain    Past Surgical History:  Procedure Laterality Date   COLD [facility] CERVIX CONIZATION AND CURRECTAGE  08-04-2009   dr Pennie Rushing [facility]   EXCISION OF SKIN TAG N/A 08/07/2017   Procedure: EXCISION OF ANAL SKIN TAG;  Surgeon: Andria Meuse, MD;  Location: [facility];  Service: General;  Laterality: N/A;   HEMORRHOID SURGERY  1998   w/ Fistula repair   SPHINCTEROTOMY N/A 08/07/2017   Procedure: INJECTION BOTOX INTO INTERNAL SPHINCTER;  Surgeon: Andria Meuse, MD;  Location: [facility];  Service: General;  Laterality: N/A;   TUBAL LIGATION Bilateral 05-13-2008   dr Pennie Rushing [facility]   PPTL (epidural)   WISDOM TOOTH EXTRACTION  2004    Family History  Problem Relation Age of Onset   Hypertension Mother    Anal fissures Mother     Hypertension Father    Heart disease Father    Diabetes Father    Hyperlipidemia Father    Stomach cancer Maternal Grandmother    Breast cancer Maternal Grandmother    Diabetes Paternal Grandmother        insulin   Stroke Paternal Grandfather    Diabetes Paternal Grandfather    Colon cancer Neg Hx    Esophageal cancer Neg Hx    Rectal cancer Neg Hx     Social History   Socioeconomic History   Marital status: Married    Spouse name: Rylo   Number of children: 2   Years of education: HS   Highest education level: Not on file  Occupational History   Occupation: MANAGER    Employer: [employer]  Tobacco Use   Smoking status: Never   Smokeless tobacco: Never  Vaping Use   Vaping Use: Never used  Substance and Sexual Activity   Alcohol use: Yes    Comment: occasional   Drug use: No   Sexual activity: Yes    Birth control/protection: Surgical    Comment: BTL  Other Topics Concern   Not on file  Social History Narrative   Patient lives at home with family.   Patient consumes 1 cup of caffeine  daily coffee/tea.   Exercises regularly, goes to gym.   Social Determinants of Health   Financial Resource Strain: Not on file  Food Insecurity: Not on file  Transportation Needs: Not on file  Physical Activity: Not on file  Stress: Not on file  Social Connections: Not on file  Intimate Partner Violence: Not on file    Outpatient Medications Prior to Visit  Medication Sig Dispense Refill   ALPRAZolam (XANAX) 0.5 MG tablet Take 0.5 mg by mouth at bedtime as needed for sleep.     Cinnamon 500 MG capsule      cycloSPORINE (RESTASIS) 0.05 % ophthalmic emulsion Apply 1 drop to eye as directed.     methocarbamol (ROBAXIN) 500 MG tablet Take 1 tablet (500 mg total) by mouth every 8 (eight) hours as needed for muscle spasms.     Multiple Vitamin (MULTIVITAMIN) capsule Take 1 capsule by mouth daily.     Probiotic Product (PROBIOTIC FORMULA) CAPS Take 1 capsule by mouth daily.       simvastatin (ZOCOR) 40 MG tablet Take 40 mg by mouth at bedtime.      losartan (COZAAR) 50 MG tablet Take 1 tablet (50 mg total) by mouth daily. (Patient taking differently: Take 25 mg by mouth daily.) 90 tablet 1   No facility-administered medications prior to visit.    Allergies  Allergen Reactions   Flexeril [Cyclobenzaprine] Other (See Comments)    Elevated blood pressure        Objective:    Physical Exam Constitutional:      Appearance: Normal appearance. She is obese.  Cardiovascular:     Rate and Rhythm: Normal rate and regular rhythm.  Pulmonary:     Effort: Pulmonary effort is normal.  Musculoskeletal:     Right lower leg: No edema.     Left lower leg: No edema.  Neurological:     General: No focal deficit present.     Mental Status: She is alert and oriented to person, place, and time.  Psychiatric:        Mood and Affect: Mood normal.        Behavior: Behavior normal.        Thought Content: Thought content normal.        Judgment: Judgment normal.     BP 112/76   Pulse 61   Temp 98.6 F (37 C)   Resp 16   Ht 5' 9.5" (1.765 m)   Wt 207 lb (93.9 kg)   LMP 09/17/2021 (Approximate)   SpO2 99%   BMI 30.13 kg/m  Wt Readings from Last 3 Encounters:  10/18/21 207 lb (93.9 kg)  08/13/21 202 lb 6 oz (91.8 kg)  04/27/21 209 lb 6 oz (95 kg)     Health Maintenance Due  Topic Date Due   COVID-19 Vaccine (1) Never done   TETANUS/TDAP  Never done   PAP SMEAR-Modifier  02/21/2019   INFLUENZA VACCINE  09/18/2021    There are no preventive care reminders to display for this patient.  Lab Results  Component Value Date   TSH 1.70 06/09/2006   Lab Results  Component Value Date   WBC 7.8 08/01/2017   HGB 12.7 08/01/2017   HCT 38.5 08/01/2017   MCV 96.5 08/01/2017   PLT 174 08/01/2017   Lab Results  Component Value Date   NA 141 08/01/2017   K 4.1 08/01/2017   CO2 28 08/01/2017   GLUCOSE 80 08/01/2017   BUN 18 08/01/2017   CREATININE  0.79  08/01/2017   BILITOT 0.5 08/01/2017   ALKPHOS 47 08/01/2017   AST 17 08/01/2017   ALT 17 08/01/2017   PROT 7.0 08/01/2017   ALBUMIN 4.0 08/01/2017   CALCIUM 9.1 08/01/2017   ANIONGAP 5 08/01/2017   No results found for: "HGBA1C"    Assessment & Plan:   Problem List Items Addressed This Visit       Cardiovascular and Mediastinum   Primary hypertension - Primary    Continue with losartan 25 mg once daily.  Continue to monitor blood pressure periodically.  Watch low sodium diet.  F/u two months with blood pressure log.       Relevant Medications   losartan (COZAAR) 25 MG tablet    Meds ordered this encounter  Medications   DISCONTD: losartan (COZAAR) 25 MG tablet    Sig: Take 1 tablet (25 mg total) by mouth daily.    Dispense:  90 tablet    Refill:  0    Order Specific Question:   Supervising Provider    Answer:   BEDSOLE, AMY E [[id number]]   losartan (COZAAR) 25 MG tablet    Sig: Take 1 tablet (25 mg total) by mouth daily.    Dispense:  90 tablet    Refill:  0    Order Specific Question:   Supervising Provider    Answer:   Ermalene Searing, AMY E [[id number]]    Follow-up: Return in about 2 months (around 12/18/2021) for annual exam .    Mort Sawyers, FNP

## 2021-10-18 NOTE — Patient Instructions (Signed)
Continue with 1/2 tablet losartan 25 mg

## 2021-10-29 MED ORDER — LOSARTAN POTASSIUM 25 MG PO TABS: 25.0000 mg | ORAL_TABLET | Freq: Every day | ORAL | 0 refills | Status: DC

## 2021-10-29 NOTE — Addendum Note (Signed)
Addended by: Mort Sawyers on: 10/29/2021 09:35 AM   Modules accepted: Orders

## 2021-11-06 ENCOUNTER — Telehealth: Payer: Self-pay | Admitting: Family

## 2021-11-06 NOTE — Telephone Encounter (Signed)
Patient stated that she filled out a release form for her medical records to be sent over. Nothing is showing in her chart. It should of been going to [facility] Dr. Kelton Pillar. Do you have this form by any chance? Thank you!

## 2021-11-06 NOTE — Telephone Encounter (Signed)
Patient called in and rescheduled her CPE and labs. She was wanting to have the 3 month sugar panel due to being pre-diabetic. She stated her previous doctor use to do it. Thank you!

## 2021-11-08 NOTE — Telephone Encounter (Signed)
Left message to return call to our office.  

## 2021-11-09 NOTE — Telephone Encounter (Signed)
Patient returned call back in,she would like a phone call about request form.

## 2021-11-12 NOTE — Telephone Encounter (Signed)
Mailed a  ROI and will bring back, Will fax again when I receive.

## 2021-11-15 ENCOUNTER — Encounter: Payer: 59 | Admitting: Family

## 2021-11-16 ENCOUNTER — Telehealth: Payer: Self-pay | Admitting: Family

## 2021-11-16 NOTE — Telephone Encounter (Signed)
Patient came in office to drop off medical release form to [place] and she wanted to know if she filled it out correctly and has been placed in PCP folder.

## 2021-11-16 NOTE — Telephone Encounter (Signed)
Fax sent over to [facility]

## 2021-11-22 ENCOUNTER — Other Ambulatory Visit: Payer: 59

## 2021-11-23 ENCOUNTER — Encounter: Payer: Self-pay | Admitting: Family

## 2021-11-23 ENCOUNTER — Ambulatory Visit (INDEPENDENT_AMBULATORY_CARE_PROVIDER_SITE_OTHER): Payer: 59 | Admitting: Family

## 2021-11-23 ENCOUNTER — Encounter: Payer: Self-pay | Admitting: *Deleted

## 2021-11-23 VITALS — BP 128/74 | HR 70 | Temp 98.2°F | Resp 16 | Ht 69.5 in | Wt 206.2 lb

## 2021-11-23 DIAGNOSIS — Z1211 Encounter for screening for malignant neoplasm of colon: Secondary | ICD-10-CM | POA: Insufficient documentation

## 2021-11-23 DIAGNOSIS — Z0001 Encounter for general adult medical examination with abnormal findings: Secondary | ICD-10-CM | POA: Diagnosis not present

## 2021-11-23 DIAGNOSIS — E782 Mixed hyperlipidemia: Secondary | ICD-10-CM | POA: Diagnosis not present

## 2021-11-23 DIAGNOSIS — R5382 Chronic fatigue, unspecified: Secondary | ICD-10-CM

## 2021-11-23 DIAGNOSIS — R7303 Prediabetes: Secondary | ICD-10-CM | POA: Diagnosis not present

## 2021-11-23 DIAGNOSIS — I10 Essential (primary) hypertension: Secondary | ICD-10-CM | POA: Diagnosis not present

## 2021-11-23 DIAGNOSIS — R221 Localized swelling, mass and lump, neck: Secondary | ICD-10-CM | POA: Insufficient documentation

## 2021-11-23 LAB — LIPID PANEL
Cholesterol: 170 mg/dL (ref 0–200)
HDL: 65.1 mg/dL (ref >39.00)
LDL Cholesterol: 95 mg/dL (ref 0–99)
NonHDL: 104.44
Total CHOL/HDL Ratio: 3
Triglycerides: 48 mg/dL (ref 0.0–149.0)
VLDL: 9.6 mg/dL (ref 0.0–40.0)

## 2021-11-23 LAB — COMPREHENSIVE METABOLIC PANEL
ALT: 13 U/L (ref 0–35)
AST: 13 U/L (ref 0–37)
Albumin: 4.4 g/dL (ref 3.5–5.2)
Alkaline Phosphatase: 59 U/L (ref 39–117)
BUN: 13 mg/dL (ref 6–23)
CO2: 27 mEq/L (ref 19–32)
Calcium: 9.2 mg/dL (ref 8.4–10.5)
Chloride: 104 mEq/L (ref 96–112)
Creatinine, Ser: 0.79 mg/dL (ref 0.40–1.20)
GFR: 90.9 mL/min (ref >60.00)
Glucose, Bld: 90 mg/dL (ref 70–99)
Potassium: 3.9 mEq/L (ref 3.5–5.1)
Sodium: 138 mEq/L (ref 135–145)
Total Bilirubin: 0.6 mg/dL (ref 0.2–1.2)
Total Protein: 7.1 g/dL (ref 6.0–8.3)

## 2021-11-23 LAB — MICROALBUMIN / CREATININE URINE RATIO
Creatinine,U: 48.4 mg/dL
Microalb Creat Ratio: 1.4 mg/g (ref 0.0–30.0)
Microalb, Ur: <0.7 mg/dL (ref 0.0–1.9)

## 2021-11-23 LAB — VITAMIN D 25 HYDROXY (VIT D DEFICIENCY, FRACTURES): VITD: 36.84 ng/mL (ref 30.00–100.00)

## 2021-11-23 LAB — HEMOGLOBIN A1C: Hgb A1c MFr Bld: 6 % (ref 4.6–6.5)

## 2021-11-23 LAB — CBC WITH DIFFERENTIAL/PLATELET
Basophils Absolute: 0 10*3/uL (ref 0.0–0.1)
Basophils Relative: 0.5 % (ref 0.0–3.0)
Eosinophils Absolute: 0.4 10*3/uL (ref 0.0–0.7)
Eosinophils Relative: 5.4 % — ABNORMAL HIGH (ref 0.0–5.0)
HCT: 39.9 % (ref 36.0–46.0)
Hemoglobin: 13.3 g/dL (ref 12.0–15.0)
Lymphocytes Relative: 26.1 % (ref 12.0–46.0)
Lymphs Abs: 1.8 10*3/uL (ref 0.7–4.0)
MCHC: 33.3 g/dL (ref 30.0–36.0)
MCV: 95.8 fl (ref 78.0–100.0)
Monocytes Absolute: 0.4 10*3/uL (ref 0.1–1.0)
Monocytes Relative: 6.4 % (ref 3.0–12.0)
Neutro Abs: 4.2 10*3/uL (ref 1.4–7.7)
Neutrophils Relative %: 61.6 % (ref 43.0–77.0)
Platelets: 206 10*3/uL (ref 150.0–400.0)
RBC: 4.17 Mil/uL (ref 3.87–5.11)
RDW: 13.4 % (ref 11.5–15.5)
WBC: 6.8 10*3/uL (ref 4.0–10.5)

## 2021-11-23 LAB — VITAMIN B12: Vitamin B-12: 671 pg/mL (ref 211–911)

## 2021-11-23 NOTE — Assessment & Plan Note (Signed)
Pt advised of the following: Work on a diabetic diet, try to incorporate exercise at least 20-30 a day for 3 days a week or more.  Ordering hga1c pending results 

## 2021-11-23 NOTE — Assessment & Plan Note (Signed)
Unchanged, stable however pt would like eval so ordering u/s neck pending results.  Suspected prior folliculitis and or lymphadenopathy (less likely)

## 2021-11-23 NOTE — Assessment & Plan Note (Signed)
continue losartan 25 mg once daily  Pt advised of the following:  Continue medication as prescribed. Monitor blood pressure periodically and/or when you feel symptomatic. Goal is <130/90 on average. Ensure that you have rested for 30 minutes prior to checking your blood pressure. Record your readings and bring them to your next visit if necessary.work on a low sodium diet.

## 2021-11-23 NOTE — Progress Notes (Signed)
Established Patient Office Visit  Subjective:  Patient ID: Veronica Villanueva, female    DOB: 08-Feb-1976  Age: 46 y.o. MRN: [id number]  CC:  Chief Complaint  Patient presents with   Annual Exam    HPI Veronica Villanueva is here for annual CPE.   Has appt with GYN in two weeks. This is Dr. Bobbye Charleston.   Mammogram: 10/22, negative. Will repeat again this year with gyn.  Pap: 11/2020, negative.  Flu vaccine: pt declines because she will be getting it at her GYN office.  TDAP states has three years ago.   LMP: slight irregular period this month but also increased stress. Two weeks later had occurred again for 3-4 days. This just ended last week, so will be d/w gyn at f/u visit.   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  cold knife cervix conization    History of trichomonal vaginitis    Hyperlipidemia    Pre-diabetes    Right-sided Bell's palsy neurologist-  dr Metta Clines   dx 07/ 2014-- w/ chronic residual hyperacusis,slight lip droop, eye twitches, intermittant facial pain    Past Surgical History:  Procedure Laterality Date   COLD [place] AND CURRECTAGE  08-04-2009   dr Leo Grosser [place] TAG N/A 08/07/2017   Procedure: EXCISION OF ANAL SKIN TAG;  Surgeon: Ileana Roup, MD;  Location: [facility];  Service: General;  Laterality: N/A;   Calcium   w/ Fistula repair   SPHINCTEROTOMY N/A 08/07/2017   Procedure: INJECTION BOTOX INTO INTERNAL SPHINCTER;  Surgeon: Ileana Roup, MD;  Location: [facility];  Service: General;  Laterality: N/A;   TUBAL LIGATION Bilateral 05-13-2008   dr Leo Grosser [place] (epidural)   [place] EXTRACTION  2004    Family History  Problem Relation Age of Onset   Hypertension Mother    Anal fissures Mother     Hypertension Father    Heart disease Father    Diabetes Father    Hyperlipidemia Father    Stomach cancer Maternal Grandmother    Breast cancer Maternal Grandmother    Diabetes Paternal Grandmother        insulin   Stroke Paternal Grandfather    Diabetes Paternal Grandfather    Colon cancer Neg Hx    Esophageal cancer Neg Hx    Rectal cancer Neg Hx     Social History   Socioeconomic History   Marital status: Married    Spouse name: Rylo   Number of children: 2   Years of education: HS   Highest education level: Not on file  Occupational History   Occupation: MANAGER    Employer: [employer]  Tobacco Use   Smoking status: Never   Smokeless tobacco: Never  Vaping Use   Vaping Use: Never used  Substance and Sexual Activity   Alcohol use: Yes    Comment: occasional   Drug use: No   Sexual activity: Yes    Birth control/protection: Surgical    Comment: BTL  Other Topics Concern   Not on file  Social History Narrative   Patient lives at home with family.   Patient consumes 1 cup of caffeine daily coffee/tea.  Exercises regularly, goes to gym.   Social Determinants of Health   Financial Resource Strain: Not on file  Food Insecurity: Not on file  Transportation Needs: Not on file  Physical Activity: Not on file  Stress: Not on file  Social Connections: Not on file  Intimate Partner Violence: Not on file    Outpatient Medications Prior to Visit  Medication Sig Dispense Refill   ALPRAZolam (XANAX) 0.5 MG tablet Take 0.5 mg by mouth at bedtime as needed for sleep.     Cinnamon 500 MG capsule      cycloSPORINE (RESTASIS) 0.05 % ophthalmic emulsion Apply 1 drop to eye as directed.     losartan (COZAAR) 25 MG tablet Take 1 tablet (25 mg total) by mouth daily. 90 tablet 0   Multiple Vitamin (MULTIVITAMIN) capsule Take 1 capsule by mouth daily.     Probiotic Product (PROBIOTIC FORMULA) CAPS Take 1 capsule by mouth daily.      simvastatin (ZOCOR) 40 MG tablet Take  40 mg by mouth at bedtime.      methocarbamol (ROBAXIN) 500 MG tablet Take 1 tablet (500 mg total) by mouth every 8 (eight) hours as needed for muscle spasms.     No facility-administered medications prior to visit.    Allergies  Allergen Reactions   Flexeril [Cyclobenzaprine] Other (See Comments)    Elevated blood pressure    ROS Review of Systems  Review of Systems  Respiratory:  Negative for shortness of breath.   Cardiovascular:  Negative for chest pain and palpitations.  Gastrointestinal:  Negative for constipation and diarrhea.  Genitourinary:  Negative for dysuria, frequency and urgency.  Musculoskeletal:  Negative for myalgias.  Psychiatric/Behavioral:  Negative for depression and suicidal ideas.   All other systems reviewed and are negative.    Objective:    Physical Exam Vitals reviewed.  Constitutional:      General: She is not in acute distress.    Appearance: Normal appearance. She is obese. She is not ill-appearing or toxic-appearing.  HENT:     Right Ear: Tympanic membrane normal.     Left Ear: Tympanic membrane normal.     Mouth/Throat:     Mouth: Mucous membranes are moist.     Pharynx: No pharyngeal swelling.     Tonsils: No tonsillar exudate.  Eyes:     Extraocular Movements: Extraocular movements intact.     Conjunctiva/sclera: Conjunctivae normal.     Pupils: Pupils are equal, round, and reactive to light.  Neck:     Thyroid: No thyroid mass.  Cardiovascular:     Rate and Rhythm: Normal rate and regular rhythm.  Pulmonary:     Effort: Pulmonary effort is normal.     Breath sounds: Normal breath sounds.  Abdominal:     General: Abdomen is flat. Bowel sounds are normal.     Palpations: Abdomen is soft.  Musculoskeletal:        General: Normal range of motion.  Lymphadenopathy:     Cervical:     Right cervical: No superficial cervical adenopathy.    Left cervical: No superficial cervical adenopathy.  Skin:    General: Skin is warm.      Capillary Refill: Capillary refill takes less than 2 seconds.  Neurological:     General: No focal deficit present.     Mental Status: She is alert and oriented to person, place, and time.  Psychiatric:        Mood and Affect: Mood normal.  Behavior: Behavior normal.        Thought Content: Thought content normal.        Judgment: Judgment normal.       BP 128/74   Pulse 70   Temp 98.2 F (36.8 C)   Resp 16   Ht 5' 9.5" (1.765 m)   Wt 206 lb 4 oz (93.6 kg)   LMP 11/08/2021   SpO2 99%   BMI 30.02 kg/m  Wt Readings from Last 3 Encounters:  11/23/21 206 lb 4 oz (93.6 kg)  10/18/21 207 lb (93.9 kg)  08/13/21 202 lb 6 oz (91.8 kg)     Health Maintenance Due  Topic Date Due   PAP SMEAR-Modifier  02/21/2019    There are no preventive care reminders to display for this patient.  Lab Results  Component Value Date   TSH 1.70 06/09/2006   Lab Results  Component Value Date   WBC 7.8 08/01/2017   HGB 12.7 08/01/2017   HCT 38.5 08/01/2017   MCV 96.5 08/01/2017   PLT 174 08/01/2017   Lab Results  Component Value Date   NA 141 08/01/2017   K 4.1 08/01/2017   CO2 28 08/01/2017   GLUCOSE 80 08/01/2017   BUN 18 08/01/2017   CREATININE 0.79 08/01/2017   BILITOT 0.5 08/01/2017   ALKPHOS 47 08/01/2017   AST 17 08/01/2017   ALT 17 08/01/2017   PROT 7.0 08/01/2017   ALBUMIN 4.0 08/01/2017   CALCIUM 9.1 08/01/2017   ANIONGAP 5 08/01/2017   Lab Results  Component Value Date   CHOL 220 (HH) 06/09/2006   Lab Results  Component Value Date   HDL 54.7 06/09/2006   No results found for: "LDLCALC" Lab Results  Component Value Date   TRIG 92 06/09/2006   Lab Results  Component Value Date   CHOLHDL 4.0 CALC 06/09/2006   No results found for: "HGBA1C"    Assessment & Plan:   Problem List Items Addressed This Visit       Cardiovascular and Mediastinum   Primary hypertension    continue losartan 25 mg once daily  Pt advised of the following:  Continue  medication as prescribed. Monitor blood pressure periodically and/or when you feel symptomatic. Goal is <130/90 on average. Ensure that you have rested for 30 minutes prior to checking your blood pressure. Record your readings and bring them to your next visit if necessary.work on a low sodium diet.       Relevant Orders   Comprehensive metabolic panel   CBC with Differential/Platelet   Microalbumin / creatinine urine ratio     Musculoskeletal and Integument   Nodule of skin of neck    Unchanged, stable however pt would like eval so ordering u/s neck pending results.  Suspected prior folliculitis and or lymphadenopathy (less likely)      Relevant Orders   US Soft Tissue Head/Neck (NON-THYROID)     Other   Mixed hyperlipidemia - Primary    Ordered lipid panel, pending results. Work on low cholesterol diet and exercise as tolerated Continue simvastatin 40 mg       Relevant Orders   Lipid panel   Chronic fatigue    Order b12 and vitamin d pending results Did advise pt insurance may not cover vitamin D  She is ok w this.      Relevant Orders   Vitamin B12   VITAMIN D 25 Hydroxy (Vit-D Deficiency, Fractures)   Prediabetes    Pt advised of the  following: Work on a diabetic diet, try to incorporate exercise at least 20-30 a day for 3 days a week or more.   Ordering hga1c pending results      Relevant Orders   Hemoglobin A1c   Encounter for general adult medical examination with abnormal findings    Patient Counseling(The following topics were reviewed):  Preventative care handout given to pt  Health maintenance and immunizations reviewed. Please refer to Health maintenance section. Pt advised on safe sex, wearing seatbelts in car, and proper nutrition labwork ordered today for annual Dental health: Discussed importance of regular tooth brushing, flossing, and dental visits.         No orders of the defined types were placed in this encounter.   Follow-up: Return in  about 6 months (around 05/25/2022) for f/u cholesterol.    Mort Sawyers, FNP

## 2021-11-23 NOTE — Patient Instructions (Signed)
  Your imaging for ultrasound of the neck  Hsa been electronically ordered at the following location. Please call to schedule.   [address] [place] Phone [phone],  8-430 pm    Stop by the lab prior to leaving today. I will notify you of your results once received.   Recommendations on keeping yourself healthy:  - Exercise at least 30-45 minutes a day, 3-4 days a week.  - Eat a low-fat diet with lots of fruits and vegetables, up to 7-9 servings per day.  - Seatbelts can save your life. Wear them always.  - Smoke detectors on every level of your home, check batteries every year.  - Eye Doctor - have an eye exam every 1-2 years  - Safe sex - if you may be exposed to STDs, use a condom.  - Alcohol -  If you drink, do it moderately, less than 2 drinks per day.  - Reader. Choose someone to speak for you if you are not able.  - Depression is common in our stressful world.If you're feeling down or losing interest in things you normally enjoy, please come in for a visit.  - Violence - If anyone is threatening or hurting you, please call immediately.  Due to recent changes in healthcare laws, you may see results of your imaging and/or laboratory studies on MyChart before I have had a chance to review them.  I understand that in some cases there may be results that are confusing or concerning to you. Please understand that not all results are received at the same time and often I may need to interpret multiple results in order to provide you with the best plan of care or course of treatment. Therefore, I ask that you please give me 2 business days to thoroughly review all your results before contacting my office for clarification. Should we see a critical lab result, you will be contacted sooner.   I will see you again in one year for your annual comprehensive exam unless otherwise stated and or with acute concerns.  It was a pleasure seeing you today!  Please do not hesitate to reach out with any questions and or concerns.  Regards,   Eugenia Pancoast

## 2021-11-23 NOTE — Assessment & Plan Note (Signed)
Ordered lipid panel, pending results. Work on low cholesterol diet and exercise as tolerated Continue simvastatin 40 mg

## 2021-11-23 NOTE — Assessment & Plan Note (Signed)
Patient Counseling(The following topics were reviewed): ? Preventative care handout given to pt  ?Health maintenance and immunizations reviewed. Please refer to Health maintenance section. ?Pt advised on safe sex, wearing seatbelts in car, and proper nutrition ?labwork ordered today for annual ?Dental health: Discussed importance of regular tooth brushing, flossing, and dental visits. ? ? ?

## 2021-11-23 NOTE — Assessment & Plan Note (Signed)
Order b12 and vitamin d pending results Did advise pt insurance may not cover vitamin D  She is ok w this.

## 2021-11-26 ENCOUNTER — Encounter: Payer: Self-pay | Admitting: Family

## 2021-11-28 ENCOUNTER — Telehealth: Payer: Self-pay | Admitting: Family

## 2021-11-28 DIAGNOSIS — E782 Mixed hyperlipidemia: Secondary | ICD-10-CM

## 2021-11-28 DIAGNOSIS — I10 Essential (primary) hypertension: Secondary | ICD-10-CM

## 2021-11-28 MED ORDER — SIMVASTATIN 40 MG PO TABS: 40.0000 mg | ORAL_TABLET | Freq: Every day | ORAL | 3 refills | Status: DC

## 2021-11-28 MED ORDER — LOSARTAN POTASSIUM 25 MG PO TABS: 25.0000 mg | ORAL_TABLET | Freq: Every day | ORAL | 3 refills | Status: DC

## 2021-11-28 NOTE — Telephone Encounter (Signed)
Refilled

## 2021-11-28 NOTE — Telephone Encounter (Signed)
Pt needs medications called to [facility], mail order service   Uses for any of her 90 day supply meds.

## 2021-11-30 ENCOUNTER — Encounter: Payer: 59 | Admitting: Family

## 2021-12-06 ENCOUNTER — Encounter: Payer: Self-pay | Admitting: Family

## 2021-12-06 DIAGNOSIS — F411 Generalized anxiety disorder: Secondary | ICD-10-CM

## 2021-12-10 MED ORDER — ALPRAZOLAM 0.5 MG PO TABS: 0.5000 mg | ORAL_TABLET | Freq: Every evening | ORAL | 0 refills | Status: DC | PRN

## 2021-12-11 LAB — HM MAMMOGRAPHY

## 2021-12-17 ENCOUNTER — Encounter: Payer: Self-pay | Admitting: Family

## 2022-01-04 ENCOUNTER — Telehealth: Payer: 59 | Admitting: Physician Assistant

## 2022-01-04 DIAGNOSIS — B9689 Other specified bacterial agents as the cause of diseases classified elsewhere: Secondary | ICD-10-CM | POA: Diagnosis not present

## 2022-01-04 DIAGNOSIS — H109 Unspecified conjunctivitis: Secondary | ICD-10-CM

## 2022-01-04 MED ORDER — POLYMYXIN B-TRIMETHOPRIM 10000-0.1 UNIT/ML-% OP SOLN: 1.0000 [drp] | OPHTHALMIC | 0 refills | Status: DC

## 2022-01-04 NOTE — Progress Notes (Signed)

## 2022-02-14 ENCOUNTER — Other Ambulatory Visit: Payer: Self-pay

## 2022-02-14 ENCOUNTER — Telehealth: Payer: Self-pay | Admitting: Family

## 2022-02-14 DIAGNOSIS — Z8669 Personal history of other diseases of the nervous system and sense organs: Secondary | ICD-10-CM

## 2022-02-14 NOTE — Telephone Encounter (Signed)
Called and spoke to pt refill sent to [facility]

## 2022-02-14 NOTE — Telephone Encounter (Signed)
Patient would like a phone call regarding medication that is not on her med list that she takes for her bells palsy. She stated that she gave this medication to tabitha in October when she came in for her physical.

## 2022-02-14 NOTE — Telephone Encounter (Signed)
Last visit 11/23/2021 Next No follow up

## 2022-02-15 MED ORDER — METAXALONE 800 MG PO TABS: 800.0000 mg | ORAL_TABLET | ORAL | 0 refills | Status: DC | PRN

## 2022-02-21 ENCOUNTER — Telehealth: Payer: Self-pay | Admitting: *Deleted

## 2022-02-21 DIAGNOSIS — Z8669 Personal history of other diseases of the nervous system and sense organs: Secondary | ICD-10-CM

## 2022-02-21 NOTE — Telephone Encounter (Signed)
Last refill 02/15/22, #30, 0 refills  Please advise if ok to refill. Thank you!

## 2022-02-21 NOTE — Telephone Encounter (Signed)
Pt stated the prescription was [facility] & she doesn't use that pharmacy anymore due to insurance. Pt is asking can be prescription be resent? [id number] [place], [place].

## 2022-02-21 NOTE — Telephone Encounter (Signed)
From: Deborha Payment To: Office of Pleas Koch, NP Sent: 02/20/2022 10:45 PM EST Subject: Medication Renewal Request  Refills have been requested for the following medications:   metaxalone (SKELAXIN) 800 MG tablet [Katherine K Clark]  Patient Comment: [facility]  Preferred pharmacy: [facility] ([place], [address] [place] [address] Delivery method: [employer]

## 2022-02-22 MED ORDER — METAXALONE 800 MG PO TABS: 800.0000 mg | ORAL_TABLET | ORAL | 0 refills | Status: DC | PRN

## 2022-02-22 NOTE — Telephone Encounter (Signed)
[facility] has [facility]. How can we take [facility] out of her chart as an option?

## 2022-02-22 NOTE — Addendum Note (Signed)
Addended by: Eugenia Pancoast on: 02/22/2022 08:14 AM   Modules accepted: Orders

## 2022-02-26 ENCOUNTER — Telehealth: Payer: Self-pay | Admitting: Family

## 2022-02-26 NOTE — Telephone Encounter (Signed)
Please review last office visit,  Pt was advised to f/u appt IN OFFICE to be seen and we will draw fasting labs at that appointment.   Please advise pt.

## 2022-02-26 NOTE — Telephone Encounter (Signed)
Patient has been scheduled

## 2022-02-26 NOTE — Telephone Encounter (Signed)
Noted  

## 2022-02-26 NOTE — Telephone Encounter (Signed)
Pt called stating during her cpe with Dugal on 11/23/21, she wss told to return in 6 months for labs. Pt is asking can orders be put in so she schedule lab appt? Pt is asking a call back once orders have been sent in. Call back # [phone].

## 2022-02-26 NOTE — Telephone Encounter (Signed)
Please call patient and schedule appointment per PCP's message.

## 2022-05-27 ENCOUNTER — Ambulatory Visit: Payer: 59 | Admitting: Family

## 2022-06-10 ENCOUNTER — Ambulatory Visit: Payer: 59 | Admitting: Family

## 2022-06-18 ENCOUNTER — Ambulatory Visit: Payer: 59 | Admitting: Family

## 2022-06-25 ENCOUNTER — Telehealth: Payer: Self-pay | Admitting: Family

## 2022-06-25 DIAGNOSIS — Z8669 Personal history of other diseases of the nervous system and sense organs: Secondary | ICD-10-CM

## 2022-06-25 DIAGNOSIS — Z1211 Encounter for screening for malignant neoplasm of colon: Secondary | ICD-10-CM

## 2022-06-25 DIAGNOSIS — R7303 Prediabetes: Secondary | ICD-10-CM

## 2022-06-25 NOTE — Telephone Encounter (Signed)
Need assistance scheduling another appointment, patient comes early and then is able to go to work Needs a refill on her  metaxalone (SKELAXIN) 800 MG tablet  And needs to have her referral entered for colonoscopy  Would like to come get labs done if possible for next appointment, usually gets A1C, please advise

## 2022-06-25 NOTE — Telephone Encounter (Signed)
metaxalone (SKELAXIN) 800 MG tablet  LR- 02/22/22 ( 30 tabs/ no refill) LV- 11/23/21 NV- has been cancelled. See patients request.

## 2022-06-26 ENCOUNTER — Ambulatory Visit: Payer: 59 | Admitting: Family

## 2022-06-26 ENCOUNTER — Telehealth: Payer: Self-pay | Admitting: Family

## 2022-06-26 DIAGNOSIS — Z8669 Personal history of other diseases of the nervous system and sense organs: Secondary | ICD-10-CM

## 2022-06-26 MED ORDER — METAXALONE 800 MG PO TABS: 800.0000 mg | ORAL_TABLET | ORAL | 0 refills | Status: DC | PRN

## 2022-06-26 NOTE — Telephone Encounter (Signed)
   Reason for Referral Request: Sees neurologist due to bells palsy, previous provider has retired and is in need of a new neurologist   Has patient been seen PCP for this complaint? Yes, patient has bells palsy and has seen neurologist previously but provider has since retired   No,  please schedule patient for appointment for complaint.  Patient scheduled on:   Yes, please find out following information.  Referral for which specialty: Neurology   Preferred office/provider: No preferred office, patient just asked to see someone who is recommended by provider

## 2022-06-26 NOTE — Telephone Encounter (Signed)
Completed and filled.  Lab order placed. Reached out to let pt know through mychart.

## 2022-06-28 ENCOUNTER — Other Ambulatory Visit: Payer: Self-pay

## 2022-06-28 ENCOUNTER — Telehealth: Payer: Self-pay

## 2022-06-28 ENCOUNTER — Other Ambulatory Visit (INDEPENDENT_AMBULATORY_CARE_PROVIDER_SITE_OTHER): Payer: 59

## 2022-06-28 DIAGNOSIS — Z1211 Encounter for screening for malignant neoplasm of colon: Secondary | ICD-10-CM

## 2022-06-28 DIAGNOSIS — R7303 Prediabetes: Secondary | ICD-10-CM | POA: Diagnosis not present

## 2022-06-28 LAB — POCT GLYCOSYLATED HEMOGLOBIN (HGB A1C): Hemoglobin A1C: 5.6 % (ref 4.0–5.6)

## 2022-06-28 MED ORDER — NA SULFATE-K SULFATE-MG SULF 17.5-3.13-1.6 GM/177ML PO SOLN: 1.0000 | Freq: Once | ORAL | 0 refills | Status: AC

## 2022-06-28 NOTE — Addendum Note (Signed)
Addended by: Alvina Chou on: 06/28/2022 08:06 AM   Modules accepted: Orders

## 2022-06-28 NOTE — Telephone Encounter (Signed)
Gastroenterology Pre-Procedure Review  Request Date: 09/06/22 Requesting Physician: Dr. Tobi Bastos  PATIENT REVIEW QUESTIONS: The patient responded to the following health history questions as indicated:    1. Are you having any GI issues? no 2. Do you have a personal history of Polyps? no 3. Do you have a family history of Colon Cancer or Polyps? no 4. Diabetes Mellitus? no 5. Joint replacements in the past 12 months?no 6. Major health problems in the past 3 months?no 7. Any artificial heart valves, MVP, or defibrillator?no    MEDICATIONS & ALLERGIES:    Patient reports the following regarding taking any anticoagulation/antiplatelet therapy:   Plavix, Coumadin, Eliquis, Xarelto, Lovenox, Pradaxa, Brilinta, or Effient? no Aspirin? no  Patient confirms/reports the following medications:  Current Outpatient Medications  Medication Sig Dispense Refill   ALPRAZolam (XANAX) 0.5 MG tablet Take 1 tablet (0.5 mg total) by mouth at bedtime as needed for sleep. 30 tablet 0   Cinnamon 500 MG capsule      cycloSPORINE (RESTASIS) 0.05 % ophthalmic emulsion Apply 1 drop to eye as directed.     losartan (COZAAR) 25 MG tablet Take 1 tablet (25 mg total) by mouth daily. 90 tablet 3   melatonin 3 MG TABS tablet Take 3 mg by mouth at bedtime.     metaxalone (SKELAXIN) 800 MG tablet Take 1 tablet (800 mg total) by mouth as needed for muscle spasms. 30 tablet 0   Multiple Vitamin (MULTIVITAMIN) capsule Take 1 capsule by mouth daily.     Probiotic Product (PROBIOTIC FORMULA) CAPS Take 1 capsule by mouth daily.      simvastatin (ZOCOR) 40 MG tablet Take 1 tablet (40 mg total) by mouth at bedtime. 90 tablet 3   trimethoprim-polymyxin b (POLYTRIM) ophthalmic solution Place 1 drop into both eyes every 4 (four) hours. X 5 days 10 mL 0   No current facility-administered medications for this visit.    Patient confirms/reports the following allergies:  Allergies  Allergen Reactions   Flexeril [Cyclobenzaprine]  Other (See Comments)    Elevated blood pressure    No orders of the defined types were placed in this encounter.   AUTHORIZATION INFORMATION Primary Insurance: 1D#: Group #:  Secondary Insurance: 1D#: Group #:  SCHEDULE INFORMATION: Date: 09/06/22 Time: Location: [facility]

## 2022-07-01 ENCOUNTER — Encounter: Payer: Self-pay | Admitting: Neurology

## 2022-07-26 ENCOUNTER — Other Ambulatory Visit: Payer: Self-pay

## 2022-07-26 NOTE — Telephone Encounter (Signed)
Patient request to have her:   cycloSPORINE (RESTASIS) 0.05 % ophthalmic emulsion sent to [facility]  LOV- 11/23/21 NV- Not Scheduled

## 2022-08-02 NOTE — Progress Notes (Unsigned)
NEUROLOGY CONSULTATION NOTE  Veronica Villanueva MRN: [id number] DOB: [birth year]  Referring provider: Mort Sawyers, FNP Primary care provider: Mort Sawyers, FNP  Reason for consult:  Bell's palsy  Assessment/Plan:   Right sided Bell's palsy  There aren't any new therapies.  Continue symptomatic management.  Follow up as needed.   Subjective:  Veronica Villanueva is a 47 year old right-handed African American female with history of recurrent Bell's palsy.     She developed right sided Bell's palsy in July 2014.  She noted right sided facial droop, inability to close the right eye, right hyperacusis and ear pain and change in taste.  She presented to the ED on 09/16/12, where CT of head was normal.  She was treated with a 7 day course of acyclovir and 2 rounds of prednisone taper.  She followed up at that time with neurology.  Autoimmune workup, including ANA, CRP, ACE, SSA/SSB antibodies, were negative.  She later had MRI of brain without contrast on 11/02/12 and MRI of brain with contrast on 11/05/12, which were both unremarkable.    It took about 9 months before she had any significant resolution of right sided facial weakness.  She continued to have slight drooping of her lip and her right eye would feel tight and twitch.  She would experience intermittent episodes of right facial pain described as a severe aching in the right ear and radiates to the upper and lower face on the right side.  It can last 4 or 5 days.  Initially, it would occur around her menstrual cycle.  Now it occurs spontaneously every 2 weeks.  There is no associated nausea.  She takes Advil, which helps.  She has tried and failed massage therapy and acupuncture.  Her previous neurologist discussed the possibility of Botox.  She has residual right hyperacusis.  I had seen her in 2019.  For pain, she was taking gabapentin.  She didn't find it helpful for the facial spasms.  She was lost to follow up.  Symptoms are much improved.   They now only occur 1 to 2 times a month.  Triggers are stress and her menstrual cycle.  When she gets facial muscle spasms, she takes Skelaxin 800mg  with ibuprofen.  She gets monthly massages from a physical therapist.    Of note, she had a syncopal episode on 10/06/17, for which she was evaluated in the ED.  She was sitting and had an urge to urinate.  She felt nauseous and then passed out.  She reportedly was shaking for a few seconds.  She had a second event.  She returned to baseline quickly after waking up, but she did not remember the event.  She reports that she hadn't eaten for about 12 hours prior to the event.  CT of head was unremarkable.  Diagnosis uncertain but vasovagal event was suspected.  She had an EEG on 12/18/16, which was normal.  No further syncopal spells.           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  cold knife cervix conization    History of trichomonal vaginitis    Hyperlipidemia    Pre-diabetes    Right-sided Bell's palsy neurologist-  dr Shon Millet   dx 07/ 2014-- w/ chronic  residual hyperacusis,slight lip droop, eye twitches, intermittant facial pain    PAST SURGICAL HISTORY: Past Surgical History:  Procedure Laterality Date   COLD [facility] CERVIX CONIZATION AND CURRECTAGE  08-04-2009   dr Pennie [facility]   EXCISION OF SKIN TAG N/A 08/07/2017   Procedure: EXCISION OF ANAL SKIN TAG;  Surgeon: Andria Meuse, MD;  Location: [facility];  Service: General;  Laterality: N/A;   HEMORRHOID SURGERY  1998   w/ Fistula repair   SPHINCTEROTOMY N/A 08/07/2017   Procedure: INJECTION BOTOX INTO INTERNAL SPHINCTER;  Surgeon: Andria Meuse, MD;  Location: [facility];  Service: General;  Laterality: N/A;   TUBAL LIGATION Bilateral 05-13-2008   dr Pennie Rushing [facility]   PPTL  (epidural)   WISDOM TOOTH EXTRACTION  2004    MEDICATIONS: Current Outpatient Medications on File Prior to Visit  Medication Sig Dispense Refill   ALPRAZolam (XANAX) 0.5 MG tablet Take 1 tablet (0.5 mg total) by mouth at bedtime as needed for sleep. 30 tablet 0   Cinnamon 500 MG capsule      cycloSPORINE (RESTASIS) 0.05 % ophthalmic emulsion Apply 1 drop to eye as directed.     losartan (COZAAR) 25 MG tablet Take 1 tablet (25 mg total) by mouth daily. 90 tablet 3   melatonin 3 MG TABS tablet Take 3 mg by mouth at bedtime.     metaxalone (SKELAXIN) 800 MG tablet Take 1 tablet (800 mg total) by mouth as needed for muscle spasms. 30 tablet 0   Multiple Vitamin (MULTIVITAMIN) capsule Take 1 capsule by mouth daily.     Probiotic Product (PROBIOTIC FORMULA) CAPS Take 1 capsule by mouth daily.      simvastatin (ZOCOR) 40 MG tablet Take 1 tablet (40 mg total) by mouth at bedtime. 90 tablet 3   trimethoprim-polymyxin b (POLYTRIM) ophthalmic solution Place 1 drop into both eyes every 4 (four) hours. X 5 days 10 mL 0   No current facility-administered medications on file prior to visit.    ALLERGIES: Allergies  Allergen Reactions   Flexeril [Cyclobenzaprine] Other (See Comments)    Elevated blood pressure    FAMILY HISTORY: Family History  Problem Relation Age of Onset   Hypertension Mother    Anal fissures Mother    Hypertension Father    Heart disease Father    Diabetes Father    Hyperlipidemia Father    Stomach cancer Maternal Grandmother    Breast cancer Maternal Grandmother    Diabetes Paternal Grandmother        insulin   Stroke Paternal Grandfather    Diabetes Paternal Grandfather    Colon cancer Neg Hx    Esophageal cancer Neg Hx    Rectal cancer Neg Hx     Objective:  Blood pressure 139/73, pulse 84, height 5\' 10"  (1.778 m), weight 205 lb (93 kg), SpO2 93 %. General: No acute distress.  Patient appears well-groomed.   Head:  Normocephalic/atraumatic Eyes:  fundi  examined but not visualized Neck: supple, no paraspinal tenderness, full range of motion Back: No paraspinal tenderness Heart: regular rate and rhythm Lungs: Clear to auscultation bilaterally. Vascular: No carotid bruits. Neurological Exam: Mental status: alert and oriented to person, place, and time, speech fluent and not dysarthric, language intact. Cranial nerves: CN I: not tested CN II: pupils equal, round and reactive to light, visual fields intact CN III, IV, VI:  full range of motion, no nystagmus, no ptosis CN V: facial sensation intact. CN VII: mild  right upper and lower facial weakness CN VIII: hearing intact CN IX, X: gag intact, uvula midline CN XI: sternocleidomastoid and trapezius muscles intact CN XII: tongue midline Bulk & Tone: normal, no fasciculations. Motor:  muscle strength 5/5 throughout Sensation:  Pinprick, temperature and vibratory sensation intact. Deep Tendon Reflexes:  2+ throughout,  toes downgoing.   Finger to nose testing:  Without dysmetria.   Heel to shin:  Without dysmetria.   Gait:  Normal station and stride.  Romberg negative.    Thank you for allowing me to take part in the care of this patient.  Shon Millet, DO  CC: Mort Sawyers, FNP

## 2022-08-05 ENCOUNTER — Ambulatory Visit (INDEPENDENT_AMBULATORY_CARE_PROVIDER_SITE_OTHER): Payer: 59 | Admitting: Neurology

## 2022-08-05 VITALS — BP 139/73 | HR 84 | Ht 70.0 in | Wt 205.0 lb

## 2022-08-05 DIAGNOSIS — G510 Bell's palsy: Secondary | ICD-10-CM

## 2022-08-06 ENCOUNTER — Encounter: Payer: Self-pay | Admitting: Neurology

## 2022-08-12 NOTE — Telephone Encounter (Addendum)
3 month refill to [facility]  [facility] - [place], [place] - [facility] AT Portal to [facility] Phone: [phone]  Fax: [phone]

## 2022-08-12 NOTE — Telephone Encounter (Signed)
Please inform pt that her restasis is to be prescribed/refilled from her ophthalmologist. We do not fill that here.

## 2022-08-13 NOTE — Telephone Encounter (Signed)
Called patient let know she will look and see who have her last

## 2022-09-02 ENCOUNTER — Telehealth: Payer: Self-pay

## 2022-09-02 NOTE — Telephone Encounter (Signed)
Veronica Villanueva called in she has a few questions about her colonoscopy. Veronica Villanueva also wanted to know who is going to do her colonoscopy I have informed her that Dr. Tobi Bastos will be doing her procedure.

## 2022-09-02 NOTE — Telephone Encounter (Signed)
Patient inquired if she needed to stop taking her fiber supplement.  I've advised that she can continue taking her fiber supplement.  She also stated that she does have some problems with constipation which is why she takes fiber supplements.  She wanted to know if it was a good idea for her to take dulcolax.  I advised that she could take dulcolax this week leading up to her clear liquid diet to make sure she is clean.  Thanks, [place], [place]

## 2022-09-02 NOTE — Telephone Encounter (Signed)
Patient is returning your call about her colonoscopy. She said please return her call

## 2022-09-05 ENCOUNTER — Encounter: Payer: Self-pay | Admitting: Gastroenterology

## 2022-09-06 ENCOUNTER — Telehealth: Payer: Self-pay

## 2022-09-06 MED ORDER — PEG 3350-KCL-NA BICARB-NACL 420 G PO SOLR: ORAL | 0 refills | Status: DC

## 2022-09-06 NOTE — Telephone Encounter (Signed)
Called patient to reschedule her procedure to another date as she knew that we were having computer system issues. Patient agreed to reschedule for 11/01/2022. Patient requested for her to get another medication (GoLytely) as she was not able to hold in [place], I told her that I would. I then called the endoscopy unit and spoke to Trish to let her know that the patient would like to have her procedure switched to 11/01/2022.

## 2022-09-09 ENCOUNTER — Telehealth: Payer: Self-pay | Admitting: *Deleted

## 2022-09-09 NOTE — Telephone Encounter (Signed)
Patient was transfer from Trish at endo unit.  Patient would like new instructions because on Friday. Martiza sent in [place] for patient.

## 2022-09-22 ENCOUNTER — Other Ambulatory Visit: Payer: Self-pay | Admitting: Family

## 2022-09-22 DIAGNOSIS — F411 Generalized anxiety disorder: Secondary | ICD-10-CM

## 2022-09-23 NOTE — Telephone Encounter (Signed)
Prescription Request  09/23/2022  LOV: 11/23/2021  What is the name of the medication or equipment? ALPRAZolam (XANAX) 0.5 MG tablet   Have you contacted your pharmacy to request a refill? No   Which pharmacy would you like this sent to?  [id number] [place], [place] - [address] [place] [address] Phone: [phone] Fax: [phone]     Patient notified that their request is being sent to the clinical staff for review and that they should receive a response within 2 business days.   Please advise at Mobile [phone] (mobile)

## 2022-10-03 NOTE — Telephone Encounter (Signed)
Patient called to f/u on this request, states she is out of this medication. Patient wanted to know if this will be refilled? Would like refill sent to [facility], [place] is leaving out of the [place] and would like this filled if possible

## 2022-10-04 ENCOUNTER — Other Ambulatory Visit: Payer: Self-pay | Admitting: Family

## 2022-10-04 DIAGNOSIS — F411 Generalized anxiety disorder: Secondary | ICD-10-CM

## 2022-10-04 MED ORDER — ALPRAZOLAM 0.5 MG PO TABS
0.5000 mg | ORAL_TABLET | Freq: Every evening | ORAL | 0 refills | Status: DC | PRN
Start: 2022-10-04 — End: 2023-03-14

## 2022-10-17 ENCOUNTER — Telehealth: Payer: Self-pay | Admitting: Gastroenterology

## 2022-10-17 NOTE — Telephone Encounter (Signed)
Pt is requesting another copy of instructions for procedure

## 2022-10-17 NOTE — Telephone Encounter (Signed)
Spoken to patient this morning. Walk her through on MyChart how to get into Letters so she can see her colonoscopy instructions.  Will mail out another copy as requested.

## 2022-10-24 ENCOUNTER — Encounter: Payer: Self-pay | Admitting: Internal Medicine

## 2022-10-24 ENCOUNTER — Ambulatory Visit: Payer: 59 | Admitting: Internal Medicine

## 2022-10-24 VITALS — BP 110/70 | HR 73 | Temp 97.5°F | Ht 70.0 in | Wt 208.0 lb

## 2022-10-24 DIAGNOSIS — B349 Viral infection, unspecified: Secondary | ICD-10-CM

## 2022-10-24 LAB — POC COVID19 BINAXNOW: SARS Coronavirus 2 Ag: NEGATIVE

## 2022-10-24 NOTE — Progress Notes (Signed)
Subjective:    Patient ID: Veronica Villanueva, female    DOB: [birth year], 47 y.o.   MRN: [id number]  HPI Here due to respiratory infection  Started 3 days ago Mild at first---felt tired and some aches Started menses also--thought some of it was from that  Woke 2 days ago---and felt quite bad --- "depleted" Tested for COVID--negative Body aches, chills, low grade fever Some chest tightness---better with mucinex Tylenol helps the chills, etc Dark sputum with cough No SOB  Mild headache---frontal mostly Slight right ear pain--relates to past Bell's palsy on that side   Also used tylenol cold and sinus  Current Outpatient Medications on File Prior to Visit  Medication Sig Dispense Refill   ALPRAZolam (XANAX) 0.5 MG tablet Take 1 tablet (0.5 mg total) by mouth at bedtime as needed for sleep. 30 tablet 0   Cinnamon 500 MG capsule      cycloSPORINE (RESTASIS) 0.05 % ophthalmic emulsion Apply 1 drop to eye as directed.     losartan (COZAAR) 25 MG tablet Take 1 tablet (25 mg total) by mouth daily. 90 tablet 3   melatonin 3 MG TABS tablet Take 3 mg by mouth at bedtime.     metaxalone (SKELAXIN) 800 MG tablet Take 1 tablet (800 mg total) by mouth as needed for muscle spasms. 30 tablet 0   Multiple Vitamin (MULTIVITAMIN) capsule Take 1 capsule by mouth daily.     polyethylene glycol-electrolytes (NULYTELY) 420 g solution Prepare according to package instructions. Starting at 5:00 PM: Drink one 8 oz glass of mixture every 15 minutes until you finish half of the jug. Five hours prior to procedure, drink 8 oz glass of mixture every 15 minutes until it is all gone. Make sure you do not drink anything 4 hours prior to your procedure. 4000 mL 0   Probiotic Product (PROBIOTIC FORMULA) CAPS Take 1 capsule by mouth daily.      simvastatin (ZOCOR) 40 MG tablet Take 1 tablet (40 mg total) by mouth at bedtime. 90 tablet 3   No current facility-administered medications on file prior to visit.     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  cold knife cervix conization    History of trichomonal vaginitis    Hyperlipidemia    Pre-diabetes    Right-sided Bell's palsy neurologist-  dr Shon Millet   dx 07/ 2014-- w/ chronic residual hyperacusis,slight lip droop, eye twitches, intermittant facial pain    Past Surgical History:  Procedure Laterality Date   COLD [facility] CERVIX CONIZATION AND CURRECTAGE  08-04-2009   dr Pennie Rushing [facility]   EXCISION OF SKIN TAG N/A 08/07/2017   Procedure: EXCISION OF ANAL SKIN TAG;  Surgeon: Andria Meuse, MD;  Location: [facility];  Service: General;  Laterality: N/A;   HEMORRHOID SURGERY  1998   w/ Fistula repair   SPHINCTEROTOMY N/A 08/07/2017   Procedure: INJECTION BOTOX INTO INTERNAL SPHINCTER;  Surgeon: Andria Meuse, MD;  Location: [facility];  Service: General;  Laterality: N/A;   TUBAL LIGATION Bilateral 05-13-2008   dr Pennie Rushing [facility]   PPTL (epidural)   WISDOM TOOTH EXTRACTION  2004  Family History  Problem Relation Age of Onset   Hypertension Mother    Anal fissures Mother    Hypertension Father    Heart disease Father    Diabetes Father    Hyperlipidemia Father    Stomach cancer Maternal Grandmother    Breast cancer Maternal Grandmother    Diabetes Paternal Grandmother        insulin   Stroke Paternal Grandfather    Diabetes Paternal Grandfather    Colon cancer Neg Hx    Esophageal cancer Neg Hx    Rectal cancer Neg Hx     Social History   Socioeconomic History   Marital status: Married    Spouse name: Veronica Villanueva   Number of children: 2   Years of education: HS   Highest education level: Not on file  Occupational History    Occupation: MANAGER    Employer: [employer]  Tobacco Use   Smoking status: Never   Smokeless tobacco: Never  Vaping Use   Vaping status: Never Used  Substance and Sexual Activity   Alcohol use: Yes    Comment: occasional   Drug use: No   Sexual activity: Yes    Birth control/protection: Surgical    Comment: BTL  Other Topics Concern   Not on file  Social History Narrative   Patient lives at home with family.   Patient consumes 1 cup of caffeine daily coffee/tea.   Exercises regularly, goes to gym.   Social Determinants of Health   Financial Resource Strain: Not on file  Food Insecurity: Not on file  Transportation Needs: Not on file  Physical Activity: Not on file  Stress: Not on file  Social Connections: Not on file  Intimate Partner Violence: Not on file   Review of Systems Eye watery No loss of smell or taste No N/V Appetite is off--but able to eat     Objective:   Physical Exam Constitutional:      Appearance: Normal appearance.  HENT:     Head:     Comments: Mild maxillary tenderness    Right Ear: Tympanic membrane and ear canal normal.     Left Ear: Tympanic membrane and ear canal normal.     Mouth/Throat:     Pharynx: No oropharyngeal exudate or posterior oropharyngeal erythema.  Neck:     Comments: Non tender anterior cervical nodes  Pulmonary:     Effort: Pulmonary effort is normal.     Breath sounds: Normal breath sounds. No wheezing or rales.  Musculoskeletal:     Cervical back: Neck supple.  Neurological:     Mental Status: She is alert.            Assessment & Plan:

## 2022-10-24 NOTE — Assessment & Plan Note (Addendum)
Typical viral syndrome Could still be COVID despite the negative test--will retest That test is also negative  Discussed symptom relief Out of work till Monday If worsens with sinus symptoms next week--would try empiric antibiotic (probably doxy)

## 2022-10-24 NOTE — Addendum Note (Signed)
Addended by: Eual Fines on: 10/24/2022 02:16 PM   Modules accepted: Orders

## 2022-10-31 ENCOUNTER — Encounter: Payer: Self-pay | Admitting: Gastroenterology

## 2022-11-01 ENCOUNTER — Ambulatory Visit: Payer: 59 | Admitting: Certified Registered"

## 2022-11-01 ENCOUNTER — Encounter
Admission: RE | Disposition: A | Discharge status: Home / Self Care | Payer: Self-pay | Source: Ambulatory Visit | Attending: Gastroenterology

## 2022-11-01 ENCOUNTER — Ambulatory Visit
Admission: RE | Admit: 2022-11-01 | Discharge: 2022-11-01 | Disposition: A | Discharge status: Home / Self Care | Payer: 59 | Source: Ambulatory Visit | Attending: Gastroenterology | Admitting: Gastroenterology

## 2022-11-01 ENCOUNTER — Encounter: Payer: Self-pay | Admitting: Gastroenterology

## 2022-11-01 DIAGNOSIS — K635 Polyp of colon: Secondary | ICD-10-CM | POA: Insufficient documentation

## 2022-11-01 DIAGNOSIS — E785 Hyperlipidemia, unspecified: Secondary | ICD-10-CM | POA: Insufficient documentation

## 2022-11-01 DIAGNOSIS — D120 Benign neoplasm of cecum: Secondary | ICD-10-CM | POA: Diagnosis not present

## 2022-11-01 DIAGNOSIS — D126 Benign neoplasm of colon, unspecified: Secondary | ICD-10-CM

## 2022-11-01 DIAGNOSIS — Z1211 Encounter for screening for malignant neoplasm of colon: Secondary | ICD-10-CM

## 2022-11-01 HISTORY — PX: COLONOSCOPY WITH PROPOFOL: SHX5780

## 2022-11-01 HISTORY — PX: POLYPECTOMY: SHX5525

## 2022-11-01 LAB — POCT PREGNANCY, URINE: Preg Test, Ur: NEGATIVE

## 2022-11-01 SURGERY — COLONOSCOPY WITH PROPOFOL
Anesthesia: General

## 2022-11-01 MED ORDER — SODIUM CHLORIDE 0.9 % IV SOLN: INTRAVENOUS | Status: DC

## 2022-11-01 MED ORDER — PROPOFOL 500 MG/50ML IV EMUL
INTRAVENOUS | Status: DC | PRN
  Administered 2022-11-01: 175 ug/kg/min via INTRAVENOUS

## 2022-11-01 MED ORDER — LIDOCAINE HCL (CARDIAC) PF 100 MG/5ML IV SOSY
PREFILLED_SYRINGE | INTRAVENOUS | Status: DC | PRN
  Administered 2022-11-01: 40 mg via INTRAVENOUS

## 2022-11-01 MED ORDER — PROPOFOL 10 MG/ML IV BOLUS
INTRAVENOUS | Status: DC | PRN
  Administered 2022-11-01: 100 mg via INTRAVENOUS

## 2022-11-01 NOTE — Op Note (Signed)
[facility] Patient Name: Veronica Villanueva Procedure Date: 11/01/2022 8:24 AM MRN: [id number] Account #: [id number] Date of Birth: 08/20/1975 Admit Type: Outpatient Age: 47 Room: [facility] ENDO ROOM 4 Gender: Female Note Status: Finalized Instrument Name: Nelda Marseille [id number] Procedure:             Colonoscopy Indications:           Screening for colorectal malignant neoplasm Providers:             Wyline Mood MD, MD Referring MD:          Mort Sawyers (Referring MD) Medicines:             Monitored Anesthesia Care Complications:         No immediate complications. Procedure:             Pre-Anesthesia Assessment:                        - Prior to the procedure, a History and Physical was                         performed, and patient medications, allergies and                         sensitivities were reviewed. The patient's tolerance                         of previous anesthesia was reviewed.                        - The risks and benefits of the procedure and the                         sedation options and risks were discussed with the                         patient. All questions were answered and informed                         consent was obtained.                        - ASA Grade Assessment: II - A patient with mild                         systemic disease.                        After obtaining informed consent, the colonoscope was                         passed under direct vision. Throughout the procedure,                         the patient's blood pressure, pulse, and oxygen                         saturations were monitored continuously. The                         Colonoscope was introduced through  the anus and                         advanced to the the cecum, identified by the                         appendiceal orifice. The colonoscopy was performed                         with ease. The patient tolerated the procedure well.                          The quality of the bowel preparation was excellent.                         The ileocecal valve, appendiceal orifice, and rectum                         were photographed. Findings:      The perianal and digital rectal examinations were normal.      A 3 mm polyp was found in the cecum. The polyp was sessile. The polyp       was removed with a jumbo cold forceps. Resection and retrieval were       complete.      A 5 mm polyp was found in the ascending colon. The polyp was sessile.       The polyp was removed with a cold snare. Resection was complete, but the       polyp tissue was not retrieved.      The exam was otherwise without abnormality on direct and retroflexion       views. Impression:            - One 3 mm polyp in the cecum, removed with a jumbo                         cold forceps. Resected and retrieved.                        - One 5 mm polyp in the ascending colon, removed with                         a cold snare. Complete resection. Polyp tissue not                         retrieved.                        - The examination was otherwise normal on direct and                         retroflexion views. Recommendation:        - Discharge patient to home (with escort).                        - Resume previous diet.                        - Continue present medications.                        -  Await pathology results.                        - Repeat colonoscopy in 5 years for surveillance. Procedure Code(s):     --- Professional ---                        [phone], Colonoscopy, flexible; with removal of                         tumor(s), polyp(s), or other lesion(s) by snare                         technique                        45380, 59, Colonoscopy, flexible; with biopsy, single                         or multiple Diagnosis Code(s):     --- Professional ---                        Z12.11, Encounter for screening for malignant neoplasm                          of colon                        D12.0, Benign neoplasm of cecum                        D12.2, Benign neoplasm of ascending colon CPT copyright 2022 American Medical Association. All rights reserved. The codes documented in this report are preliminary and upon coder review may  be revised to meet current compliance requirements. Wyline Mood, MD Wyline Mood MD, MD 11/01/2022 8:45:20 AM This report has been signed electronically. Number of Addenda: 0 Note Initiated On: 11/01/2022 8:24 AM Scope Withdrawal Time: 0 hours 10 minutes 45 seconds  Total Procedure Duration: 0 hours 14 minutes 43 seconds  Estimated Blood Loss:  Estimated blood loss: none.      [facility]

## 2022-11-01 NOTE — Transfer of Care (Signed)
Immediate Anesthesia Transfer of Care Note  Patient: Veronica Villanueva  Procedure(s) Performed: COLONOSCOPY WITH PROPOFOL POLYPECTOMY  Patient Location: Endoscopy Unit  Anesthesia Type:MAC  Level of Consciousness: awake, drowsy, and patient cooperative  Airway & Oxygen Therapy: Patient Spontanous Breathing and Patient connected to face mask oxygen  Post-op Assessment: Report given to RN, Post -op Vital signs reviewed and stable, and Patient moving all extremities X 4  Post vital signs: Reviewed and stable  Last Vitals:  Vitals Value Taken Time  BP    Temp    Pulse    Resp    SpO2      Last Pain:  Vitals:   11/01/22 0750  TempSrc: Temporal  PainSc: 0-No pain         Complications: No notable events documented.

## 2022-11-01 NOTE — Anesthesia Preprocedure Evaluation (Signed)
Anesthesia Evaluation  Patient identified by MRN, date of birth, ID band Patient awake    Reviewed: Allergy & Precautions, NPO status , Patient's Chart, lab work & pertinent test results  Airway Mallampati: III  TM Distance: >3 FB Neck ROM: full    Dental  (+) Chipped, Dental Advidsory Given   Pulmonary neg pulmonary ROS   Pulmonary exam normal        Cardiovascular negative cardio ROS Normal cardiovascular exam     Neuro/Psych negative neurological ROS  negative psych ROS   GI/Hepatic Neg liver ROS,GERD  ,,  Endo/Other  negative endocrine ROS    Renal/GU negative Renal ROS  negative genitourinary   Musculoskeletal   Abdominal   Peds  Hematology negative hematology ROS (+)   Anesthesia Other Findings Past Medical History: No date: Chronic anal fissure No date: Chronically dry eyes No date: History of cardiac murmur as a child No date: 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  s/p  cold knife cervix conization No date: History of trichomonal vaginitis No date: Hyperlipidemia No date: Pre-diabetes neurologist-  dr Madelaine Bhat jaffe: Right-sided Bell's palsy     Comment:  dx 07/ 2014-- w/ chronic residual hyperacusis,slight lip              droop, eye twitches, intermittant facial pain  Past Surgical History: 08-04-2009   dr Pennie [facility]: COLD [facility] CERVIX CONIZATION AND  CURRECTAGE 08/07/2017: EXCISION OF SKIN TAG; N/A     Comment:  Procedure: EXCISION OF ANAL SKIN TAG;  Surgeon: Andria Meuse, MD;  Location: [facility];              Service: General;  Laterality: N/A; 1998: HEMORRHOID SURGERY     Comment:  w/ Fistula repair 08/07/2017: SPHINCTEROTOMY; N/A     Comment:  Procedure: INJECTION BOTOX INTO INTERNAL SPHINCTER;                Surgeon: Andria Meuse, MD;  Location: [facility];  Service: General;  Laterality:  N/A; 05-13-2008   dr Pennie Rushing [facility]: TUBAL LIGATION; Bilateral     Comment:  PPTL (epidural) 2004: WISDOM TOOTH EXTRACTION  BMI    Body Mass Index: 29.87 kg/m      Reproductive/Obstetrics negative OB ROS                             Anesthesia Physical Anesthesia Plan  ASA: 2  Anesthesia Plan: General   Post-op Pain Management: Minimal or no pain anticipated   Induction: Intravenous  PONV Risk Score and Plan: 3 and Propofol infusion, TIVA and Ondansetron  Airway Management Planned: Nasal Cannula  Additional Equipment: None  Intra-op Plan:   Post-operative Plan:   Informed Consent: I have reviewed the patients History and Physical, chart, labs and discussed the procedure including the risks, benefits and alternatives for the proposed anesthesia with the patient or authorized representative who has indicated his/her understanding and acceptance.     Dental advisory given  Plan Discussed with: CRNA and Surgeon  Anesthesia Plan Comments: (Discussed risks of anesthesia with patient, including possibility of difficulty with spontaneous ventilation under anesthesia necessitating airway intervention, PONV, and rare risks such as cardiac or respiratory or neurological events, and allergic reactions.  Discussed the role of CRNA in patient's perioperative care. Patient understands.)       Anesthesia Quick Evaluation

## 2022-11-01 NOTE — Anesthesia Postprocedure Evaluation (Signed)
Anesthesia Post Note  Patient: SHARMIN NOMURA  Procedure(s) Performed: COLONOSCOPY WITH PROPOFOL POLYPECTOMY  Patient location during evaluation: PACU Anesthesia Type: General Level of consciousness: awake and alert Pain management: pain level controlled Vital Signs Assessment: post-procedure vital signs reviewed and stable Respiratory status: spontaneous breathing, nonlabored ventilation, respiratory function stable and patient connected to nasal cannula oxygen Cardiovascular status: blood pressure returned to baseline and stable Postop Assessment: no apparent nausea or vomiting Anesthetic complications: no  No notable events documented.   Last Vitals:  Vitals:   11/01/22 0750 11/01/22 0848  BP: (!) 138/91 (!) 93/50  Pulse: 63 69  Resp: 18 (!) 23  Temp: (!) 35.8 C   SpO2: 100% 100%    Last Pain:  Vitals:   11/01/22 0848  TempSrc:   PainSc: Asleep                 Stephanie Coup

## 2022-11-01 NOTE — H&P (Signed)
Wyline Mood, MD [address], [place], [place], [address] [address], [place], [place], [address] Phone: [phone]  Fax: [phone]  Primary Care Physician:  Mort Sawyers, FNP   Pre-Procedure History & Physical: HPI:  Veronica Villanueva is a 47 y.o. female is here for an colonoscopy.   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  cold knife cervix conization    History of trichomonal vaginitis    Hyperlipidemia    Pre-diabetes    Right-sided Bell's palsy neurologist-  dr Shon Millet   dx 07/ 2014-- w/ chronic residual hyperacusis,slight lip droop, eye twitches, intermittant facial pain    Past Surgical History:  Procedure Laterality Date   COLD [facility] CERVIX CONIZATION AND CURRECTAGE  08-04-2009   dr Pennie Rushing [facility]   EXCISION OF SKIN TAG N/A 08/07/2017   Procedure: EXCISION OF ANAL SKIN TAG;  Surgeon: Andria Meuse, MD;  Location: [facility];  Service: General;  Laterality: N/A;   HEMORRHOID SURGERY  1998   w/ Fistula repair   SPHINCTEROTOMY N/A 08/07/2017   Procedure: INJECTION BOTOX INTO INTERNAL SPHINCTER;  Surgeon: Andria Meuse, MD;  Location: [facility];  Service: General;  Laterality: N/A;   TUBAL LIGATION Bilateral 05-13-2008   dr Pennie Rushing [facility]   PPTL (epidural)   WISDOM TOOTH EXTRACTION  2004    Prior to Admission medications   Medication Sig Start Date End Date Taking? Authorizing Provider  ALPRAZolam Prudy Feeler) 0.5 MG tablet Take 1 tablet (0.5 mg total) by mouth at bedtime as needed for sleep. 10/04/22  Yes Dugal, Wyatt Mage, FNP  Cinnamon 500 MG capsule    Yes [provider]  cycloSPORINE (RESTASIS) 0.05 % ophthalmic emulsion Apply 1 drop to eye as directed.   Yes [provider]  losartan (COZAAR) 25 MG tablet Take 1 tablet (25 mg total)  by mouth daily. 11/28/21  Yes Dugal, Tabitha, FNP  melatonin 3 MG TABS tablet Take 3 mg by mouth at bedtime.   Yes [provider]  metaxalone (SKELAXIN) 800 MG tablet Take 1 tablet (800 mg total) by mouth as needed for muscle spasms. 06/26/22  Yes Mort Sawyers, FNP  Multiple Vitamin (MULTIVITAMIN) capsule Take 1 capsule by mouth daily.   Yes [provider]  polyethylene glycol-electrolytes (NULYTELY) 420 g solution Prepare according to package instructions. Starting at 5:00 PM: Drink one 8 oz glass of mixture every 15 minutes until you finish half of the jug. Five hours prior to procedure, drink 8 oz glass of mixture every 15 minutes until it is all gone. Make sure you do not drink anything 4 hours prior to your procedure. 09/06/22  Yes Wyline Mood, MD  Probiotic Product (PROBIOTIC FORMULA) CAPS Take 1 capsule by mouth daily.    Yes [provider]  simvastatin (ZOCOR) 40 MG tablet Take 1 tablet (40 mg total) by mouth at bedtime. 11/28/21  Yes Mort Sawyers, FNP    Allergies as of 06/28/2022 - Review Complete 06/28/2022  Allergen Reaction Noted   Flexeril [cyclobenzaprine] Other (See Comments) 08/13/2021    Family History  Problem Relation Age of Onset   Hypertension Mother    Anal fissures Mother    Hypertension Father    Heart disease Father  Diabetes Father    Hyperlipidemia Father    Stomach cancer Maternal Grandmother    Breast cancer Maternal Grandmother    Diabetes Paternal Grandmother        insulin   Stroke Paternal Grandfather    Diabetes Paternal Grandfather    Colon cancer Neg Hx    Esophageal cancer Neg Hx    Rectal cancer Neg Hx     Social History   Socioeconomic History   Marital status: Married    Spouse name: Rylo   Number of children: 2   Years of education: HS   Highest education level: Not on file  Occupational History   Occupation: MANAGER    Employer: [employer]  Tobacco Use   Smoking status: Never   Smokeless  tobacco: Never  Vaping Use   Vaping status: Never Used  Substance and Sexual Activity   Alcohol use: Yes    Comment: occasional   Drug use: No   Sexual activity: Yes    Birth control/protection: Surgical    Comment: BTL  Other Topics Concern   Not on file  Social History Narrative   Patient lives at home with family.   Patient consumes 1 cup of caffeine daily coffee/tea.   Exercises regularly, goes to gym.   Social Determinants of Health   Financial Resource Strain: Not on file  Food Insecurity: Not on file  Transportation Needs: Not on file  Physical Activity: Not on file  Stress: Not on file  Social Connections: Not on file  Intimate Partner Violence: Not on file    Review of Systems: See HPI, otherwise negative ROS  Physical Exam: BP (!) 138/91   Pulse 63   Temp (!) 96.4 F (35.8 C) (Temporal)   Resp 18   Ht 5' 9.5" (1.765 m)   Wt 93.1 kg   LMP 10/15/2022 Comment: pregnancy test negative  SpO2 100%   BMI 29.87 kg/m  General:   Alert,  pleasant and cooperative in NAD Head:  Normocephalic and atraumatic. Neck:  Supple; no masses or thyromegaly. Lungs:  Clear throughout to auscultation, normal respiratory effort.    Heart:  +S1, +S2, Regular rate and rhythm, No edema. Abdomen:  Soft, nontender and nondistended. Normal bowel sounds, without guarding, and without rebound.   Neurologic:  Alert and  oriented x4;  grossly normal neurologically.  Impression/Plan: Veronica Villanueva is here for an colonoscopy to be performed for Screening colonoscopy average risk   Risks, benefits, limitations, and alternatives regarding  colonoscopy have been reviewed with the patient.  Questions have been answered.  All parties agreeable.   Wyline Mood, MD  11/01/2022, 8:20 AM

## 2022-11-04 ENCOUNTER — Encounter: Payer: Self-pay | Admitting: Gastroenterology

## 2022-11-04 LAB — SURGICAL PATHOLOGY

## 2022-11-04 NOTE — Progress Notes (Signed)
noted 

## 2022-11-05 ENCOUNTER — Encounter: Payer: Self-pay | Admitting: Gastroenterology

## 2022-11-07 ENCOUNTER — Ambulatory Visit: Payer: 59 | Admitting: Neurology

## 2022-11-10 ENCOUNTER — Other Ambulatory Visit: Payer: Self-pay | Admitting: Family

## 2022-11-10 DIAGNOSIS — E782 Mixed hyperlipidemia: Secondary | ICD-10-CM

## 2022-11-28 ENCOUNTER — Encounter: Payer: 59 | Admitting: Family

## 2022-11-28 ENCOUNTER — Ambulatory Visit: Payer: 59 | Admitting: Family

## 2022-11-28 ENCOUNTER — Encounter: Payer: Self-pay | Admitting: Family

## 2022-11-28 VITALS — BP 132/84 | HR 76 | Temp 98.5°F | Ht 69.0 in | Wt 207.6 lb

## 2022-11-28 DIAGNOSIS — R5382 Chronic fatigue, unspecified: Secondary | ICD-10-CM

## 2022-11-28 DIAGNOSIS — I10 Essential (primary) hypertension: Secondary | ICD-10-CM

## 2022-11-28 DIAGNOSIS — Z862 Personal history of diseases of the blood and blood-forming organs and certain disorders involving the immune mechanism: Secondary | ICD-10-CM | POA: Diagnosis not present

## 2022-11-28 DIAGNOSIS — R7303 Prediabetes: Secondary | ICD-10-CM | POA: Diagnosis not present

## 2022-11-28 DIAGNOSIS — Z860100 Personal history of colon polyps, unspecified: Secondary | ICD-10-CM | POA: Insufficient documentation

## 2022-11-28 DIAGNOSIS — E782 Mixed hyperlipidemia: Secondary | ICD-10-CM

## 2022-11-28 DIAGNOSIS — Z8669 Personal history of other diseases of the nervous system and sense organs: Secondary | ICD-10-CM

## 2022-11-28 DIAGNOSIS — I7300 Raynaud's syndrome without gangrene: Secondary | ICD-10-CM

## 2022-11-28 LAB — IBC + FERRITIN
Ferritin: 40.4 ng/mL (ref 10.0–291.0)
Iron: 128 ug/dL (ref 42–145)
Saturation Ratios: 35.4 % (ref 20.0–50.0)
TIBC: 361.2 ug/dL (ref 250.0–450.0)
Transferrin: 258 mg/dL (ref 212.0–360.0)

## 2022-11-28 LAB — CBC
HCT: 42 % (ref 36.0–46.0)
Hemoglobin: 13.6 g/dL (ref 12.0–15.0)
MCHC: 32.3 g/dL (ref 30.0–36.0)
MCV: 97.7 fL (ref 78.0–100.0)
Platelets: 217 10*3/uL (ref 150.0–400.0)
RBC: 4.3 Mil/uL (ref 3.87–5.11)
RDW: 13.9 % (ref 11.5–15.5)
WBC: 6.1 10*3/uL (ref 4.0–10.5)

## 2022-11-28 LAB — LIPID PANEL
Cholesterol: 176 mg/dL (ref 0–200)
HDL: 67.2 mg/dL (ref >39.00)
LDL Cholesterol: 101 mg/dL — ABNORMAL HIGH (ref 0–99)
NonHDL: 108.72
Total CHOL/HDL Ratio: 3
Triglycerides: 39 mg/dL (ref 0.0–149.0)
VLDL: 7.8 mg/dL (ref 0.0–40.0)

## 2022-11-28 LAB — HEMOGLOBIN A1C: Hgb A1c MFr Bld: 5.9 % (ref 4.6–6.5)

## 2022-11-28 MED ORDER — PREDNISONE 10 MG (21) PO TBPK
ORAL_TABLET | ORAL | 0 refills | Status: DC
Start: 2022-11-28 — End: 2023-09-26

## 2022-11-28 NOTE — Progress Notes (Signed)
Established Patient Office Visit  Subjective:      CC:  Chief Complaint  Patient presents with   Annual Exam    HPI: Veronica Villanueva is a 47 y.o. female presenting on 11/28/2022 for Annual Exam . HTN: on losartan 25 mg once daily.   HLD: on simvastatin 40 mg once nightly tolerating well.   Exercising when she can, goal is being very active.   Prediabetic: trying to work on diet and exercise.    New complaints: Still with hands and feet being cold, having to sit at her desk with gloves.  Doesn't note change in color of extremities during these times.   H/o bells palsy, gets monthly massage and needling when noted     Social history:  Relevant past medical, surgical, family and social history reviewed and updated as indicated. Interim medical history since our last visit reviewed.  Allergies and medications reviewed and updated.  DATA REVIEWED: CHART IN EPIC     ROS: Negative unless specifically indicated above in HPI.    Current Outpatient Medications:    ALPRAZolam (XANAX) 0.5 MG tablet, Take 1 tablet (0.5 mg total) by mouth at bedtime as needed for sleep., Disp: 30 tablet, Rfl: 0   Cinnamon 500 MG capsule, , Disp: , Rfl:    cycloSPORINE (RESTASIS) 0.05 % ophthalmic emulsion, Apply 1 drop to eye as directed., Disp: , Rfl:    losartan (COZAAR) 25 MG tablet, Take 1 tablet (25 mg total) by mouth daily., Disp: 90 tablet, Rfl: 3   melatonin 3 MG TABS tablet, Take 3 mg by mouth at bedtime., Disp: , Rfl:    metaxalone (SKELAXIN) 800 MG tablet, Take 1 tablet (800 mg total) by mouth as needed for muscle spasms., Disp: 30 tablet, Rfl: 0   Multiple Vitamin (MULTIVITAMIN) capsule, Take 1 capsule by mouth daily., Disp: , Rfl:    predniSONE (STERAPRED UNI-PAK 21 TAB) 10 MG (21) TBPK tablet, Take as directed, Disp: 1 each, Rfl: 0   Probiotic Product (PROBIOTIC FORMULA) CAPS, Take 1 capsule by mouth daily. , Disp: , Rfl:    simvastatin (ZOCOR) 40 MG tablet, TAKE 1 TABLET  AT BEDTIME, Disp: 90 tablet, Rfl: 3      Objective:    BP 132/84 (BP Location: Left Arm, Patient Position: Sitting, Cuff Size: Normal)   Pulse 76   Temp 98.5 F (36.9 C) (Oral)   Ht 5\' 9"  (1.753 m)   Wt 207 lb 9.6 oz (94.2 kg)   LMP 10/15/2022 Comment: pregnancy test negative  SpO2 99%   BMI 30.66 kg/m   Wt Readings from Last 3 Encounters:  11/28/22 207 lb 9.6 oz (94.2 kg)  11/01/22 205 lb 3.6 oz (93.1 kg)  10/24/22 208 lb (94.3 kg)    Physical Exam Constitutional:      General: She is not in acute distress.    Appearance: Normal appearance. She is normal weight. She is not ill-appearing, toxic-appearing or diaphoretic.  HENT:     Head: Normocephalic.  Cardiovascular:     Rate and Rhythm: Normal rate and regular rhythm.  Pulmonary:     Effort: Pulmonary effort is normal.     Breath sounds: Normal breath sounds.  Musculoskeletal:        General: Normal range of motion.  Neurological:     General: No focal deficit present.     Mental Status: She is alert and oriented to person, place, and time. Mental status is at baseline.  Psychiatric:  Mood and Affect: Mood normal.        Behavior: Behavior normal.        Thought Content: Thought content normal.        Judgment: Judgment normal.           Assessment & Plan:  Chronic fatigue -     ANA w/Reflex  History of anemia -     IBC + Ferritin -     CBC  Mixed hyperlipidemia -     Lipid panel  Prediabetes Assessment & Plan: Pt advised of the following: Work on a diabetic diet, try to incorporate exercise at least 20-30 a day for 3 days a week or more.   Ordering hga1c pending results  Orders: -     Hemoglobin A1c  History of Bell's palsy Assessment & Plan: Symptomatic mildly  Rx prednisone pack prn If flares become more frequent will refer again to neurology for second opinion Pt with recent viral infection suspect cause of flare  Orders: -     predniSONE; Take as directed  Dispense: 1 each;  Refill: 0 -     ANA w/Reflex  Raynaud's phenomenon without gangrene Assessment & Plan: Suspected via symptoms discussed.  Will order ANA as well as r/o autoimmune cause.  Wear mittens/gloves prn  Orders: -     ANA w/Reflex  History of colon polyps  Primary hypertension Assessment & Plan: continue losartan 25 mg once daily         Return in about 6 months (around 05/29/2023) for f/u CPE.  Mort Sawyers, MSN, APRN, FNP-C [facility]

## 2022-11-28 NOTE — Assessment & Plan Note (Signed)
Symptomatic mildly  Rx prednisone pack prn If flares become more frequent will refer again to neurology for second opinion Pt with recent viral infection suspect cause of flare

## 2022-11-28 NOTE — Assessment & Plan Note (Signed)
continue losartan 25 mg once daily

## 2022-11-28 NOTE — Assessment & Plan Note (Signed)
Pt advised of the following: Work on a diabetic diet, try to incorporate exercise at least 20-30 a day for 3 days a week or more.  Ordering hga1c pending results

## 2022-11-28 NOTE — Assessment & Plan Note (Signed)
Suspected via symptoms discussed.  Will order ANA as well as r/o autoimmune cause.  Wear mittens/gloves prn

## 2022-11-29 LAB — ANA W/REFLEX: Anti Nuclear Antibody (ANA): NEGATIVE

## 2022-12-20 ENCOUNTER — Other Ambulatory Visit: Payer: Self-pay | Admitting: Obstetrics and Gynecology

## 2022-12-20 DIAGNOSIS — R928 Other abnormal and inconclusive findings on diagnostic imaging of breast: Secondary | ICD-10-CM

## 2022-12-25 ENCOUNTER — Other Ambulatory Visit: Payer: Self-pay | Admitting: Obstetrics and Gynecology

## 2022-12-25 ENCOUNTER — Ambulatory Visit
Admission: RE | Admit: 2022-12-25 | Discharge: 2022-12-25 | Disposition: A | Discharge status: Home / Self Care | Payer: 59 | Source: Ambulatory Visit | Attending: Obstetrics and Gynecology | Admitting: Obstetrics and Gynecology

## 2022-12-25 DIAGNOSIS — R928 Other abnormal and inconclusive findings on diagnostic imaging of breast: Secondary | ICD-10-CM

## 2022-12-25 DIAGNOSIS — N6320 Unspecified lump in the left breast, unspecified quadrant: Secondary | ICD-10-CM

## 2022-12-26 ENCOUNTER — Other Ambulatory Visit: Payer: 59

## 2022-12-27 ENCOUNTER — Ambulatory Visit
Admission: RE | Admit: 2022-12-27 | Discharge: 2022-12-27 | Disposition: A | Discharge status: Home / Self Care | Payer: 59 | Source: Ambulatory Visit | Attending: Obstetrics and Gynecology | Admitting: Obstetrics and Gynecology

## 2022-12-27 DIAGNOSIS — R928 Other abnormal and inconclusive findings on diagnostic imaging of breast: Secondary | ICD-10-CM

## 2022-12-27 DIAGNOSIS — N6320 Unspecified lump in the left breast, unspecified quadrant: Secondary | ICD-10-CM

## 2022-12-27 HISTORY — PX: BREAST BIOPSY: SHX20

## 2022-12-29 ENCOUNTER — Other Ambulatory Visit: Payer: Self-pay | Admitting: Family

## 2022-12-29 DIAGNOSIS — I10 Essential (primary) hypertension: Secondary | ICD-10-CM

## 2022-12-30 LAB — SURGICAL PATHOLOGY

## 2023-03-14 ENCOUNTER — Other Ambulatory Visit: Payer: Self-pay | Admitting: Family

## 2023-03-14 DIAGNOSIS — Z8669 Personal history of other diseases of the nervous system and sense organs: Secondary | ICD-10-CM

## 2023-03-14 DIAGNOSIS — F411 Generalized anxiety disorder: Secondary | ICD-10-CM

## 2023-03-17 MED ORDER — METAXALONE 800 MG PO TABS
800.0000 mg | ORAL_TABLET | ORAL | 0 refills | Status: DC | PRN
Start: 2023-03-17 — End: 2023-04-28

## 2023-03-17 MED ORDER — ALPRAZOLAM 0.5 MG PO TABS: 0.5000 mg | ORAL_TABLET | Freq: Every evening | ORAL | 0 refills | Status: AC | PRN

## 2023-03-22 ENCOUNTER — Telehealth: Payer: 59 | Admitting: Family Medicine

## 2023-03-22 DIAGNOSIS — R21 Rash and other nonspecific skin eruption: Secondary | ICD-10-CM

## 2023-03-22 MED ORDER — PREDNISONE 20 MG PO TABS: 20.0000 mg | ORAL_TABLET | Freq: Two times a day (BID) | ORAL | 0 refills | Status: AC

## 2023-03-22 NOTE — Progress Notes (Signed)
E-visit done--DWB

## 2023-03-22 NOTE — Progress Notes (Signed)
E Visit for Rash  We are sorry that you are not feeling well. Here is how we plan to help!  I will send 5 days of prednisone. Continue hydrocortisone.   HOME CARE:  Take cool showers and avoid direct sunlight. Apply cool compress or wet dressings. Take a bath in an oatmeal bath.  Sprinkle content of one Aveeno packet under running faucet with comfortably warm water.  Bathe for 15-20 minutes, 1-2 times daily.  Pat dry with a towel. Do not rub the rash. Use hydrocortisone cream. Take an antihistamine like Benadryl for widespread rashes that itch.  The adult dose of Benadryl is 25-50 mg by mouth 4 times daily. Caution:  This type of medication may cause sleepiness.  Do not drink alcohol, drive, or operate dangerous machinery while taking antihistamines.  Do not take these medications if you have prostate enlargement.  Read package instructions thoroughly on all medications that you take.  GET HELP RIGHT AWAY IF:  Symptoms don't go away after treatment. Severe itching that persists. If you rash spreads or swells. If you rash begins to smell. If it blisters and opens or develops a yellow-brown crust. You develop a fever. You have a sore throat. You become short of breath.  MAKE SURE YOU:  Understand these instructions. Will watch your condition. Will get help right away if you are not doing well or get worse.  Thank you for choosing an e-visit.  Your e-visit answers were reviewed by a board certified advanced clinical practitioner to complete your personal care plan. Depending upon the condition, your plan could have included both over the counter or prescription medications.  Please review your pharmacy choice. Make sure the pharmacy is open so you can pick up prescription now. If there is a problem, you may contact your provider through [employer] and have the prescription routed to another pharmacy.  Your safety is important to [place]. If you have drug allergies check your  prescription carefully.   For the next 24 hours you can use MyChart to ask questions about today's visit, request a non-urgent call back, or ask for a work or school excuse. You will get an email in the next two days asking about your experience. I hope that your e-visit has been valuable and will speed your recovery.    have provided 5 minutes of non face to face time during this encounter for chart review and documentation.

## 2023-03-24 ENCOUNTER — Encounter: Payer: Self-pay | Admitting: Family

## 2023-04-25 ENCOUNTER — Other Ambulatory Visit: Payer: Self-pay | Admitting: Family

## 2023-04-25 DIAGNOSIS — Z8669 Personal history of other diseases of the nervous system and sense organs: Secondary | ICD-10-CM

## 2023-04-28 ENCOUNTER — Telehealth: Payer: Self-pay

## 2023-04-28 NOTE — Telephone Encounter (Signed)
 Patient called stating that she had her screening colonoscopy 11/01/2022 and she stated that everything was billed under wellness except anesthesia due to coding. She stated that she had reached out to her insurance, our billing department and they told her that the code had to be changed by Dr.Anna. I told her that I did not know the answer to that but that I would notify our manager Ginger. Patient stated that she understood and that she would be expecting a call back from the manager to solve this issue as she is not wanting to pay $800 for anesthesia since everything else was covered.

## 2023-05-01 NOTE — Telephone Encounter (Signed)
 LVM informing pt anesthesia is a separate part of the colonoscopy. We cannot change the codes they put in to bill. She will need to contact anesthesia to request this as I do not have the ability to change that code to rebill.

## 2023-05-01 NOTE — Telephone Encounter (Signed)
 Ginger, since Dr. Tobi Bastos can't help, is there anything that you could do to help the patient with the coding? Thank you.

## 2023-05-20 ENCOUNTER — Telehealth: Payer: Self-pay | Admitting: Family

## 2023-05-20 NOTE — Telephone Encounter (Signed)
-----   Message from Alvina Chou sent at 05/09/2023  2:22 PM EDT ----- Regarding: Lab orders for [facility], 4.10.25 Lab orders, thanks

## 2023-05-20 NOTE — Telephone Encounter (Signed)
 Please advise pt for lab only appt she is actually due for her 6 month f/u appt with me. Can cancel lab appt and make f/u appt in office.

## 2023-05-21 NOTE — Telephone Encounter (Signed)
 Patient scheduled.

## 2023-05-21 NOTE — Telephone Encounter (Signed)
 LVM for patient to cb and schedule office visit instead of lab visit.

## 2023-05-29 ENCOUNTER — Other Ambulatory Visit: Payer: 59

## 2023-06-03 ENCOUNTER — Other Ambulatory Visit: Payer: Self-pay | Admitting: Family

## 2023-06-03 ENCOUNTER — Encounter: Payer: Self-pay | Admitting: Family

## 2023-06-03 ENCOUNTER — Ambulatory Visit: Admitting: Family

## 2023-06-03 ENCOUNTER — Ambulatory Visit

## 2023-06-03 VITALS — BP 122/62 | HR 73 | Temp 99.0°F | Ht 69.0 in | Wt 207.0 lb

## 2023-06-03 DIAGNOSIS — R7303 Prediabetes: Secondary | ICD-10-CM

## 2023-06-03 DIAGNOSIS — E782 Mixed hyperlipidemia: Secondary | ICD-10-CM

## 2023-06-03 DIAGNOSIS — I10 Essential (primary) hypertension: Secondary | ICD-10-CM

## 2023-06-03 LAB — MICROALBUMIN / CREATININE URINE RATIO
Creatinine,U: 59 mg/dL
Microalb Creat Ratio: UNDETERMINED mg/g (ref 0.0–30.0)
Microalb, Ur: <0.7 mg/dL

## 2023-06-03 LAB — LIPID PANEL
Cholesterol: 183 mg/dL (ref 0–200)
HDL: 60.9 mg/dL (ref >39.00)
LDL Cholesterol: 113 mg/dL — ABNORMAL HIGH (ref 0–99)
NonHDL: 121.85
Total CHOL/HDL Ratio: 3
Triglycerides: 45 mg/dL (ref 0.0–149.0)
VLDL: 9 mg/dL (ref 0.0–40.0)

## 2023-06-03 LAB — HEMOGLOBIN A1C: Hgb A1c MFr Bld: 5.8 % (ref 4.6–6.5)

## 2023-06-03 MED ORDER — EZETIMIBE 10 MG PO TABS
10.0000 mg | ORAL_TABLET | Freq: Every day | ORAL | 3 refills | Status: DC
Start: 2023-06-03 — End: 2023-06-03

## 2023-06-03 NOTE — Progress Notes (Signed)
 Established Patient Office Visit  Subjective:   Patient ID: Veronica Villanueva, female    DOB: Mar 29, 1975  Age: 48 y.o. MRN: [id number]  CC:  Chief Complaint  Patient presents with   Follow-up    6 month follow up A1C    HPI: Veronica Villanueva is a 48 y.o. female presenting on 06/03/2023 for Follow-up (6 month follow up A1C)  Prediabetes: last A1c 5.9. she states she pays very close attention to her diet.   HLD: on simvastatin 40 mg nightly. LDL is at 101. She has also since started fish oils.   HTN: on losartan 25 mg once daily.   Wt Readings from Last 3 Encounters:  06/03/23 207 lb (93.9 kg)  11/28/22 207 lb 9.6 oz (94.2 kg)  11/01/22 205 lb 3.6 oz (93.1 kg)          ROS: Negative unless specifically indicated above in HPI.   Relevant past medical history reviewed and updated as indicated.   Allergies and medications reviewed and updated.   Current Outpatient Medications:    ALPRAZolam (XANAX) 0.5 MG tablet, Take 1 tablet (0.5 mg total) by mouth at bedtime as needed for sleep., Disp: 30 tablet, Rfl: 0   Cinnamon 500 MG capsule, , Disp: , Rfl:    cycloSPORINE (RESTASIS) 0.05 % ophthalmic emulsion, Apply 1 drop to eye as directed., Disp: , Rfl:    losartan (COZAAR) 25 MG tablet, TAKE 1 TABLET DAILY, Disp: 90 tablet, Rfl: 3   melatonin 3 MG TABS tablet, Take 3 mg by mouth at bedtime., Disp: , Rfl:    metaxalone (SKELAXIN) 800 MG tablet, TAKE 1 TABLET (800 MG TOTAL) BY MOUTH AS NEEDED FOR MUSCLE SPASMS., Disp: 30 tablet, Rfl: 0   Multiple Vitamin (MULTIVITAMIN) capsule, Take 1 capsule by mouth daily., Disp: , Rfl:    Omega-3 Fatty Acids (FISH OIL) 600 MG CAPS, Take 600 mg by mouth 2 (two) times daily., Disp: , Rfl:    predniSONE (STERAPRED UNI-PAK 21 TAB) 10 MG (21) TBPK tablet, Take as directed, Disp: 1 each, Rfl: 0   simvastatin (ZOCOR) 40 MG tablet, TAKE 1 TABLET AT BEDTIME, Disp: 90 tablet, Rfl: 3  Allergies  Allergen Reactions   Flexeril [Cyclobenzaprine] Other  (See Comments)    Elevated blood pressure    Objective:   BP 122/62   Pulse 73   Temp 99 F (37.2 C) (Oral)   Ht 5\' 9"  (1.753 m)   Wt 207 lb (93.9 kg)   SpO2 100%   BMI 30.57 kg/m    Physical Exam Constitutional:      General: She is not in acute distress.    Appearance: Normal appearance. She is normal weight. She is not ill-appearing, toxic-appearing or diaphoretic.  HENT:     Head: Normocephalic.  Cardiovascular:     Rate and Rhythm: Normal rate and regular rhythm.  Pulmonary:     Effort: Pulmonary effort is normal.  Musculoskeletal:        General: Normal range of motion.  Neurological:     General: No focal deficit present.     Mental Status: She is alert and oriented to person, place, and time. Mental status is at baseline.  Psychiatric:        Mood and Affect: Mood normal.        Behavior: Behavior normal.        Thought Content: Thought content normal.        Judgment: Judgment normal.  Assessment & Plan:  Primary hypertension Assessment & Plan: continue losartan 25 mg once daily      Mixed hyperlipidemia Assessment & Plan: Ordered lipid panel, pending results. Work on low cholesterol diet and exercise as tolerated  Continue simvastatin 40 mg nightly.   Orders: -     Lipid panel  Prediabetes Assessment & Plan: Pt advised of the following: Work on a diabetic diet, try to incorporate exercise at least 20-30 a day for 3 days a week or more.   Ordering A1c    Orders: -     Microalbumin / creatinine urine ratio -     Hemoglobin A1c     Follow up plan: Return in about 6 months (around 12/03/2023) for f/u CPE.  Felicita Horns, FNP

## 2023-06-03 NOTE — Assessment & Plan Note (Signed)
 Ordered lipid panel, pending results. Work on low cholesterol diet and exercise as tolerated  Continue simvastatin 40 mg nightly.

## 2023-06-03 NOTE — Assessment & Plan Note (Signed)
 Pt advised of the following: Work on a diabetic diet, try to incorporate exercise at least 20-30 a day for 3 days a week or more.  Ordering A1c

## 2023-06-03 NOTE — Assessment & Plan Note (Signed)
continue losartan 25 mg once daily

## 2023-06-06 ENCOUNTER — Encounter: Payer: Self-pay | Admitting: Family

## 2023-06-06 DIAGNOSIS — E7889 Other lipoprotein metabolism disorders: Secondary | ICD-10-CM

## 2023-06-06 DIAGNOSIS — E7801 Familial hypercholesterolemia: Secondary | ICD-10-CM

## 2023-06-06 DIAGNOSIS — R9431 Abnormal electrocardiogram [ECG] [EKG]: Secondary | ICD-10-CM

## 2023-06-06 DIAGNOSIS — I10 Essential (primary) hypertension: Secondary | ICD-10-CM

## 2023-06-06 DIAGNOSIS — E782 Mixed hyperlipidemia: Secondary | ICD-10-CM

## 2023-06-06 LAB — EXTRA SPECIMEN

## 2023-06-06 LAB — LIPOPROTEIN A (LPA): Lipoprotein (a): 327 nmol/L — ABNORMAL HIGH (ref <75)

## 2023-06-09 ENCOUNTER — Encounter: Payer: Self-pay | Admitting: Family

## 2023-06-09 DIAGNOSIS — E7889 Other lipoprotein metabolism disorders: Secondary | ICD-10-CM | POA: Insufficient documentation

## 2023-06-09 NOTE — Telephone Encounter (Signed)
 Copied from CRM [id number]. Topic: Clinical - Lab/Test Results >> Jun 06, 2023 11:26 AM Veronica Villanueva wrote: Reason for CRM: patient received some test results and she would like a call from the doctor to understand what they mean. Please advise. Thank you.

## 2023-06-10 ENCOUNTER — Encounter

## 2023-06-11 ENCOUNTER — Ambulatory Visit
Admission: RE | Admit: 2023-06-11 | Discharge: 2023-06-11 | Disposition: A | Discharge status: Home / Self Care | Payer: Self-pay | Source: Ambulatory Visit | Attending: Family | Admitting: Family

## 2023-06-11 DIAGNOSIS — E7801 Familial hypercholesterolemia: Secondary | ICD-10-CM | POA: Insufficient documentation

## 2023-06-11 DIAGNOSIS — R9431 Abnormal electrocardiogram [ECG] [EKG]: Secondary | ICD-10-CM | POA: Insufficient documentation

## 2023-06-12 ENCOUNTER — Encounter: Payer: Self-pay | Admitting: Family

## 2023-06-12 DIAGNOSIS — E782 Mixed hyperlipidemia: Secondary | ICD-10-CM

## 2023-06-12 DIAGNOSIS — R9389 Abnormal findings on diagnostic imaging of other specified body structures: Secondary | ICD-10-CM

## 2023-06-20 ENCOUNTER — Encounter: Payer: Self-pay | Admitting: Family

## 2023-06-20 DIAGNOSIS — M791 Myalgia, unspecified site: Secondary | ICD-10-CM

## 2023-06-20 DIAGNOSIS — R7303 Prediabetes: Secondary | ICD-10-CM

## 2023-06-23 ENCOUNTER — Other Ambulatory Visit: Payer: Self-pay | Admitting: Family

## 2023-06-23 DIAGNOSIS — Q279 Congenital malformation of peripheral vascular system, unspecified: Secondary | ICD-10-CM

## 2023-06-23 DIAGNOSIS — R9389 Abnormal findings on diagnostic imaging of other specified body structures: Secondary | ICD-10-CM

## 2023-06-24 MED ORDER — ROSUVASTATIN CALCIUM 5 MG PO TABS: 5.0000 mg | ORAL_TABLET | Freq: Every day | ORAL | 3 refills | Status: AC

## 2023-06-24 NOTE — Telephone Encounter (Signed)
 Periods of episodes of muscle spasming she will typically get dry needling and or muscle relaxer which provides her some relief. She states recently she is now waking up to this. She was curious if her simvastatin  could be contributing. Not daily sometimes every other week.   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 Pt agreeable to CTA for further evaluation. We will await results and determine next course of action. She does have dizziness at times and some memory concerns. Could consider referral to interventional radiology pending CTA.   We will change simvastatin  to rosuvastatin to see if muscle spasms improve.

## 2023-06-26 ENCOUNTER — Encounter: Payer: Self-pay | Admitting: *Deleted

## 2023-06-30 ENCOUNTER — Telehealth: Payer: Self-pay | Admitting: Neurology

## 2023-06-30 ENCOUNTER — Telehealth: Payer: Self-pay | Admitting: Family

## 2023-06-30 NOTE — Telephone Encounter (Signed)
 Patient was on Zocor  40mg  I left message to call if any thing was needed.

## 2023-06-30 NOTE — Telephone Encounter (Signed)
 Copied from CRM [id number]. Topic: General - Other >> Jun 30, 2023  8:48 AM Emylou G wrote: Reason for CRM: Patient called adv made her CT appt: Outpatient [place] 5/21 8am.. Please expedite process for insurance approval Patient adv to put info in [place]

## 2023-06-30 NOTE — Telephone Encounter (Signed)
 Patient needs to speak with someone to get information about her medication profile

## 2023-06-30 NOTE — Telephone Encounter (Signed)
 Pt called in wanting to see if our records showed the dosage of sivastatin she was on when she was coming here? She has had an allergic reaction to a recently prescribed rosubastatin. Her current doctor was wanting to find out. They are trying to find out when she transferred from the 20 to 40 mg of the sivastatin.

## 2023-07-05 ENCOUNTER — Encounter: Payer: Self-pay | Admitting: Family

## 2023-07-07 NOTE — Telephone Encounter (Signed)
 Copied from CRM [id number]. Topic: Clinical - Request for Lab/Test Order >> Jul 07, 2023  7:43 AM [name] wrote: Reason for CRM: Patient is stating that she didn't have a comprehensive/complete metabolic panel done at her physical 11/2022, hasn't had one since 2023. She's scheduled for a CT on Wednesday and was told by radiologist that that's one of the things they look at as well along with the CT for the issue. Patient sent provider a message in Mychart as well, patient would like to have labs done today.  [place] [phone] >> Jul 07, 2023 11:56 AM Howard Macho wrote: Patient called stating she called earlier today before the office opened up regarding some lab work and she wanted to make sure that the message was sent to the office

## 2023-07-07 NOTE — Telephone Encounter (Signed)
 Copied from CRM [id number]. Topic: Clinical - Request for Lab/Test Order >> Jul 07, 2023  7:43 AM [name] wrote: Reason for CRM: Patient is stating that she didn't have a comprehensive/complete metabolic panel done at her physical 11/2022, hasn't had one since 2023. She's scheduled for a CT on Wednesday and was told by radiologist that that's one of the things they look at as well along with the CT for the issue. Patient sent provider a message in Mychart as well, patient would like to have labs done today.  [name] [phone]

## 2023-07-08 ENCOUNTER — Ambulatory Visit: Admitting: Dietician

## 2023-07-09 ENCOUNTER — Ambulatory Visit

## 2023-07-09 ENCOUNTER — Ambulatory Visit
Admission: RE | Admit: 2023-07-09 | Discharge: 2023-07-09 | Disposition: A | Discharge status: Home / Self Care | Source: Ambulatory Visit | Attending: Family | Admitting: Family

## 2023-07-09 DIAGNOSIS — R9389 Abnormal findings on diagnostic imaging of other specified body structures: Secondary | ICD-10-CM | POA: Insufficient documentation

## 2023-07-09 MED ORDER — IOHEXOL 350 MG/ML SOLN
75.0000 mL | Freq: Once | INTRAVENOUS | Status: AC | PRN
  Administered 2023-07-09: 75 mL via INTRAVENOUS

## 2023-07-11 ENCOUNTER — Other Ambulatory Visit (INDEPENDENT_AMBULATORY_CARE_PROVIDER_SITE_OTHER)

## 2023-07-11 ENCOUNTER — Ambulatory Visit: Payer: Self-pay | Admitting: Family

## 2023-07-11 DIAGNOSIS — M791 Myalgia, unspecified site: Secondary | ICD-10-CM

## 2023-07-11 DIAGNOSIS — Q2572 Congenital pulmonary arteriovenous malformation: Secondary | ICD-10-CM | POA: Insufficient documentation

## 2023-07-11 DIAGNOSIS — R7303 Prediabetes: Secondary | ICD-10-CM | POA: Diagnosis not present

## 2023-07-11 DIAGNOSIS — E782 Mixed hyperlipidemia: Secondary | ICD-10-CM | POA: Diagnosis not present

## 2023-07-11 LAB — LIPID PANEL
Cholesterol: 199 mg/dL (ref 0–200)
HDL: 69.7 mg/dL (ref >39.00)
LDL Cholesterol: 118 mg/dL — ABNORMAL HIGH (ref 0–99)
NonHDL: 129.09
Total CHOL/HDL Ratio: 3
Triglycerides: 54 mg/dL (ref 0.0–149.0)
VLDL: 10.8 mg/dL (ref 0.0–40.0)

## 2023-07-11 LAB — COMPREHENSIVE METABOLIC PANEL WITH GFR
ALT: 13 U/L (ref 0–35)
AST: 13 U/L (ref 0–37)
Albumin: 4.7 g/dL (ref 3.5–5.2)
Alkaline Phosphatase: 54 U/L (ref 39–117)
BUN: 13 mg/dL (ref 6–23)
CO2: 26 meq/L (ref 19–32)
Calcium: 9.4 mg/dL (ref 8.4–10.5)
Chloride: 104 meq/L (ref 96–112)
Creatinine, Ser: 0.75 mg/dL (ref 0.40–1.20)
GFR: 95.65 mL/min (ref >60.00)
Glucose, Bld: 90 mg/dL (ref 70–99)
Potassium: 4.3 meq/L (ref 3.5–5.1)
Sodium: 139 meq/L (ref 135–145)
Total Bilirubin: 0.5 mg/dL (ref 0.2–1.2)
Total Protein: 7.3 g/dL (ref 6.0–8.3)

## 2023-07-11 LAB — CK: Total CK: 90 U/L (ref 7–177)

## 2023-07-14 ENCOUNTER — Ambulatory Visit: Payer: Self-pay | Admitting: Family

## 2023-07-16 ENCOUNTER — Ambulatory Visit: Admitting: Family

## 2023-07-21 ENCOUNTER — Ambulatory Visit

## 2023-07-22 ENCOUNTER — Telehealth: Payer: Self-pay | Admitting: Family

## 2023-07-22 NOTE — Telephone Encounter (Signed)
 Copied from CRM [id number]. Topic: Referral - Question >> Jul 22, 2023  9:00 AM Alyse July wrote: Reason for CRM: Patient was informed to give the office a call back if she has not been contacted by the referred office which was Interventional Radiology. Patient states its been over a week. [phone].

## 2023-07-29 ENCOUNTER — Other Ambulatory Visit: Payer: Self-pay | Admitting: Family

## 2023-07-29 DIAGNOSIS — Q2572 Congenital pulmonary arteriovenous malformation: Secondary | ICD-10-CM

## 2023-07-29 NOTE — Telephone Encounter (Signed)
 I have reached out to [facility] IR dept regarding this order to see what is going on with scheduling.

## 2023-07-29 NOTE — Telephone Encounter (Signed)
 I have heard back from [facility] IR, [name], she is looking into this order and will let me know something.

## 2023-07-31 ENCOUNTER — Other Ambulatory Visit

## 2023-08-07 ENCOUNTER — Ambulatory Visit
Admission: RE | Admit: 2023-08-07 | Discharge: 2023-08-07 | Disposition: A | Discharge status: Home / Self Care | Source: Ambulatory Visit | Attending: Family | Admitting: Family

## 2023-08-07 ENCOUNTER — Ambulatory Visit: Payer: Self-pay | Admitting: Family

## 2023-08-07 DIAGNOSIS — Q2572 Congenital pulmonary arteriovenous malformation: Secondary | ICD-10-CM

## 2023-08-07 HISTORY — PX: IR RADIOLOGIST EVAL & MGMT: IMG5224

## 2023-08-07 NOTE — Progress Notes (Signed)
 noted

## 2023-08-07 NOTE — Consult Note (Signed)
 Chief Complaint: Patient was seen in consultation today for pulmonary AVM at the request of Dugal,Tabitha  Referring Physician(s): Dugal,Tabitha  History of Present Illness: Veronica Villanueva is a 48 y.o. female who presents to discuss an incidentally detected pulmonary AVM.  She had a routine cardiac calcium  score CT scan for risk stratifiucation in the setting of hypercholesterolemia and a RML pulmonary nodule was seen with a configuration suggesting a pulmonary AVM.  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  While the nidus measures up to 5 mm, the feeding artery is tiny and measures less than 2 mm.   She has had no issues with SOB, hypoxia, stroke or TIA.  She does have a list of other issues that she wanted to discuss to see if they could be related.    Prior Bell's palsy, elevated cholesterol, chronic coldness of the hands and feet, intermittent headaches and a head rush and associated headache that occurs occasionally after intercourse.  I assured her that none of the symptoms are likely related to her AVM.     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  cold knife cervix conization    History of trichomonal vaginitis    Hyperlipidemia    Pre-diabetes    Right-sided Bell's palsy neurologist-  dr Janne Members   dx 07/ 2014-- w/ chronic residual hyperacusis,slight lip droop, eye twitches, intermittant facial pain    Past Surgical History:  Procedure Laterality Date   BREAST BIOPSY Left 12/27/2022   US  LT BREAST BX W LOC DEV 1ST LESION IMG BX SPEC US  GUIDE 12/27/2022 [facility] MAMMOGRAPHY   COLD [facility] CERVIX CONIZATION AND CURRECTAGE  08-04-2009   dr Coy Ditty [facility]   COLONOSCOPY WITH PROPOFOL  N/A 11/01/2022   Procedure: COLONOSCOPY WITH PROPOFOL ;  Surgeon: Luke Salaam, MD;  Location:  [facility] ENDOSCOPY;  Service: Gastroenterology;  Laterality: N/A;   EXCISION OF SKIN TAG N/A 08/07/2017   Procedure: EXCISION OF ANAL SKIN TAG;  Surgeon: Melvenia Stabs, MD;  Location: [facility];  Service: General;  Laterality: N/A;   HEMORRHOID SURGERY  1998   w/ Fistula repair   IR RADIOLOGIST EVAL & MGMT  08/07/2023   POLYPECTOMY  11/01/2022   Procedure: POLYPECTOMY;  Surgeon: Luke Salaam, MD;  Location: [facility] END[facility]COPY;  Service: Gastroenterology;;   [address] N/A 08/07/2017   Procedure: INJECTION BOTOX  INTO INTERNAL SPHINCTER;  Surgeon: Melvenia Stabs, MD;  Location: [facility];  Service: General;  Laterality: N/A;   TUBAL LIGATION Bilateral 05-13-2008   dr Coy Ditty [facility]   PPTL (epidural)   WISDOM TOOTH EXTRACTION  2004    Allergies: Simvastatin  and Flexeril [cyclobenzaprine]  Medications: Prior to Admission medications   Medication Sig Start Date End Date Taking? Authorizing Provider  ALPRAZolam  (XANAX ) 0.5 MG tablet Take 1 tablet (0.5 mg total) by mouth at bedtime as needed for sleep. 03/17/23  Yes Dugal, Tabitha, FNP  Cinnamon 500 MG capsule    Yes [provider]  cycloSPORINE (RESTASIS) 0.05 % ophthalmic emulsion Apply 1 drop to eye as directed.   Yes [provider]  losartan  (COZAAR ) 25 MG tablet TAKE 1 TABLET DAILY 12/30/22  Yes Dugal, Tabitha, FNP  melatonin 3 MG TABS tablet Take  3 mg by mouth at bedtime.   Yes [provider]  metaxalone  (SKELAXIN ) 800 MG tablet TAKE 1 TABLET (800 MG TOTAL) BY MOUTH AS NEEDED FOR MUSCLE SPASMS. 04/28/23  Yes Dugal, Tabitha, FNP  Multiple Vitamin (MULTIVITAMIN) capsule Take 1 capsule by mouth daily.   Yes [provider]  Omega-3 Fatty Acids (FISH OIL) 600 MG CAPS Take 600 mg by mouth 2 (two) times daily.   Yes [provider]  rosuvastatin  (CRESTOR ) 5 MG tablet Take 1 tablet (5 mg total) by mouth daily. 06/24/23  Yes Dugal, Melba Spittle, FNP  predniSONE  (STERAPRED  UNI-PAK 21 TAB) 10 MG (21) TBPK tablet Take as directed Patient not taking: Reported on 08/07/2023 11/28/22   Felicita Horns, FNP     Family History  Problem Relation Age of Onset   Hypertension Mother    Anal fissures Mother    Hypertension Father    Heart disease Father    Diabetes Father    Hyperlipidemia Father    Stomach cancer Maternal Grandmother    Breast cancer Maternal Grandmother    Diabetes Paternal Grandmother        insulin   Stroke Paternal Grandfather    Diabetes Paternal Grandfather    Colon cancer Neg Hx    Esophageal cancer Neg Hx    Rectal cancer Neg Hx     Social History   Socioeconomic History   Marital status: Married    Spouse name: Rylo   Number of children: 2   Years of education: HS   Highest education level: Not on file  Occupational History   Occupation: MANAGER    Employer: [employer]  Tobacco Use   Smoking status: Never   Smokeless tobacco: Never  Vaping Use   Vaping status: Never Used  Substance and Sexual Activity   Alcohol use: Yes    Comment: occasional   Drug use: No   Sexual activity: Yes    Birth control/protection: Surgical    Comment: BTL  Other Topics Concern   Not on file  Social History Narrative   Patient lives at home with family.   Patient consumes 1 cup of caffeine daily coffee/tea.   Exercises regularly, goes to gym.   Social Drivers of Corporate investment banker Strain: Not on file  Food Insecurity: Not on file  Transportation Needs: Not on file  Physical Activity: Not on file  Stress: Not on file  Social Connections: Not on file   Review of Systems: A 12 point ROS discussed and pertinent positives are indicated in the HPI above.  All other systems are negative.  Review of Systems  Vital Signs: BP (!) 142/85 (BP Location: Left Arm, Patient Position: Sitting, Cuff Size: Normal)   Pulse 73   Temp 98 F (36.7 C) (Oral)   Resp 16   SpO2 100%    Physical Exam Constitutional:      General: She  is not in acute distress.    Appearance: Normal appearance.  HENT:     Head: Normocephalic and atraumatic.   Eyes:     General: No scleral icterus.   Cardiovascular:     Rate and Rhythm: Normal rate.  Pulmonary:     Effort: Pulmonary effort is normal.  Abdominal:     General: There is no distension.     Tenderness: There is no guarding.   Skin:    General: Skin is warm and dry.   Neurological:     Mental Status: She is  alert and oriented to person, place, and time.   Psychiatric:        Mood and Affect: Mood normal.        Behavior: Behavior normal.      Imaging: IR Radiologist Eval & Mgmt Result Date: 08/07/2023 EXAM: NEW PATIENT OFFICE VISIT CHIEF COMPLAINT: SEE NOTE IN EPIC HISTORY OF PRESENT ILLNESS: SEE NOTE IN EPIC REVIEW OF SYSTEMS: SEE NOTE IN EPIC PHYSICAL EXAMINATION: SEE NOTE IN EPIC ASSESSMENT AND PLAN: SEE NOTE IN EPIC Electronically Signed   By: Fernando Hoyer M.D.   On: 08/07/2023 09:05   CT ANGIO CHEST AORTA W/CM & OR WO/CM Result Date: 07/10/2023 CLINICAL DATA:  Pulmonary AVM suspected EXAM: CT ANGIOGRAPHY CHEST WITH CONTRAST TECHNIQUE: Multidetector CT imaging of the chest was performed using the standard protocol during bolus administration of intravenous contrast. Multiplanar CT image reconstructions and MIPs were obtained to evaluate the vascular anatomy. Multiplanar image (3D post-processing) reconstructions and MIPs were obtained to evaluate the vascular anatomy. RADIATION DOSE REDUCTION: This exam was performed according to the departmental dose-optimization program which includes automated exposure control, adjustment of the mA and/or kV according to patient size and/or use of iterative reconstruction technique. CONTRAST:  75mL OMNIPAQUE  IOHEXOL  350 MG/ML SOLN COMPARISON:  CT of the chest performed June 11, 2023 FINDINGS: Cardiovascular: Normal size heart. No pericardial effusion. Two vessel aortic arch. A vascular communication between a right middle  lobe branch pulmonary artery and pulmonary vein is present peripherally adjacent to the right heart border which is best observed on image 85 of CT series 5. The presumed nidus of this vascular anomaly measures approximately 5 mm x 5 mm. Craniocaudal imaging profiles the pulmonary arterial and pulmonary venous anastomosis on image 27 of CT series 8. Main pulmonary artery: Within normal limits for size. Mediastinum/Nodes: No enlarged mediastinal, hilar, or axillary lymph nodes. Thyroid  gland, trachea, and esophagus demonstrate no significant findings. Lungs/Pleura: Pulmonary vascular malformation detailed above. No pleural effusion or pneumothorax. Upper Abdomen: No acute abnormality. Musculoskeletal: No chest wall abnormality. No acute or significant osseous findings. Review of the MIP images confirms the above findings. IMPRESSION: 1. 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 Electronically Signed   By: Reagan Camera M.D.   On: 07/10/2023 06:08    Labs:  CBC: Recent Labs    11/28/22 0807  WBC 6.1  HGB 13.6  HCT 42.0  PLT 217.0    COAGS: No results for input(s): INR, APTT in the last 8760 hours.  BMP: Recent Labs    07/11/23 1138  NA 139  K 4.3  CL 104  CO2 26  GLUCOSE 90  BUN 13  CALCIUM  9.4  CREATININE 0.75    LIVER FUNCTION TESTS: Recent Labs    07/11/23 1138  BILITOT 0.5  AST 13  ALT 13  ALKPHOS 54  PROT 7.3  ALBUMIN 4.7    TUMOR MARKERS: No results for input(s): AFPTM, CEA, CA199, CHROMGRNA in the last 8760 hours.  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  The feeding artery currently measures less than 2 mm indicating low risk.   We discussed the natural history and course of pulmonary AVMs as well as the potential risks if the lesion enlarges and the feeding artery exceeds 3  mm.  I explained that no intervention is currently indicated but we will follow this lesion  closely to assess for growth over time.   1.) F/U CTA Chest (PE Protocol) and clinic visit in 6 months.   Thank you for this interesting consult.  I greatly enjoyed meeting Veronica Villanueva and look forward to participating in their care.  A copy of this report was sent to the requesting provider on this date.  Electronically Signed: Roxie Cord 08/07/2023, 11:51 AM   I spent a total of 40 Minutes  in face to face in clinical consultation, greater than 50% of which was counseling/coordinating care for pulmonary AVM.

## 2023-08-18 ENCOUNTER — Encounter: Payer: Self-pay | Admitting: Family

## 2023-08-18 DIAGNOSIS — I10 Essential (primary) hypertension: Secondary | ICD-10-CM

## 2023-08-20 MED ORDER — LOSARTAN POTASSIUM 25 MG PO TABS: 25.0000 mg | ORAL_TABLET | Freq: Every day | ORAL | 3 refills | Status: DC

## 2023-09-05 ENCOUNTER — Ambulatory Visit: Admitting: Family

## 2023-09-26 ENCOUNTER — Ambulatory Visit (INDEPENDENT_AMBULATORY_CARE_PROVIDER_SITE_OTHER): Admitting: Family

## 2023-09-26 ENCOUNTER — Encounter: Payer: Self-pay | Admitting: Family

## 2023-09-26 VITALS — BP 118/82 | HR 77 | Temp 98.4°F | Ht 69.0 in | Wt 206.8 lb

## 2023-09-26 DIAGNOSIS — N898 Other specified noninflammatory disorders of vagina: Secondary | ICD-10-CM

## 2023-09-26 DIAGNOSIS — R5383 Other fatigue: Secondary | ICD-10-CM

## 2023-09-26 DIAGNOSIS — R7303 Prediabetes: Secondary | ICD-10-CM

## 2023-09-26 DIAGNOSIS — E782 Mixed hyperlipidemia: Secondary | ICD-10-CM | POA: Diagnosis not present

## 2023-09-26 DIAGNOSIS — I10 Essential (primary) hypertension: Secondary | ICD-10-CM | POA: Diagnosis not present

## 2023-09-26 DIAGNOSIS — B3731 Acute candidiasis of vulva and vagina: Secondary | ICD-10-CM

## 2023-09-26 LAB — CBC
HCT: 38.8 % (ref 36.0–46.0)
Hemoglobin: 13 g/dL (ref 12.0–15.0)
MCHC: 33.5 g/dL (ref 30.0–36.0)
MCV: 94.5 fl (ref 78.0–100.0)
Platelets: 215 K/uL (ref 150.0–400.0)
RBC: 4.1 Mil/uL (ref 3.87–5.11)
RDW: 12.9 % (ref 11.5–15.5)
WBC: 5.9 K/uL (ref 4.0–10.5)

## 2023-09-26 LAB — B12 AND FOLATE PANEL
Folate: 16 ng/mL (ref >5.9)
Vitamin B-12: 707 pg/mL (ref 211–911)

## 2023-09-26 LAB — LIPID PANEL
Cholesterol: 156 mg/dL (ref 0–200)
HDL: 58.2 mg/dL (ref >39.00)
LDL Cholesterol: 89 mg/dL (ref 0–99)
NonHDL: 98.18
Total CHOL/HDL Ratio: 3
Triglycerides: 48 mg/dL (ref 0.0–149.0)
VLDL: 9.6 mg/dL (ref 0.0–40.0)

## 2023-09-26 LAB — TSH: TSH: 1.54 u[IU]/mL (ref 0.35–5.50)

## 2023-09-26 MED ORDER — FLUCONAZOLE 150 MG PO TABS: ORAL_TABLET | ORAL | 0 refills | Status: AC

## 2023-09-26 MED ORDER — LOSARTAN POTASSIUM 50 MG PO TABS: 50.0000 mg | ORAL_TABLET | Freq: Every day | ORAL | 3 refills | Status: AC

## 2023-09-26 NOTE — Progress Notes (Signed)
 Established Patient Office Visit  Subjective:      CC:  Chief Complaint  Patient presents with   Medical Management of Chronic Issues    HPI: Veronica Villanueva is a 48 y.o. female presenting on 09/26/2023 for Medical Management of Chronic Issues .  Discussed the use of AI scribe software for clinical note transcription with the patient, who gave verbal consent to proceed.  History of Present Illness Veronica Villanueva is a 48 year old female with hypertension who presents for a routine follow-up visit.  She has been managing her hypertension with losartan , initially on a low dose of 25 mg, which was increased to 50 mg during the summer due to elevated readings. She attributes these changes to heat and increased activity. She reports her blood pressure readings have been around 118 or 116 over 70s with the current dose.  She prefers rosuvastatin  (Crestor ) over her previous statin due to fewer muscle-related side effects. She is currently taking 5 mg of rosuvastatin  and has significantly reduced her use of muscle relaxers. Muscle tightness occurs primarily around hormonal times but is less severe than before.  She has a history of an arteriovenous malformation (AVM) identified through a CT scan; she recalls the specialist said it would be checked once a year to monitor it. She is concerned about the cost of the CT scan due to her high deductible but has agreed with IR that she will follow up annually rather than every six months.   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 These symptoms have not been previously discussed in detail, and no specific workup has been conducted.  She experiences occasional yeast infections, particularly in the summer, which she associates with her partner not being circumcised. Symptoms include tingling and itching. She uses panty liners daily and  maintains dryness to prevent infections.         Social history:  Relevant past medical, surgical, family and social history reviewed and updated as indicated. Interim medical history since our last visit reviewed.  Allergies and medications reviewed and updated.  DATA REVIEWED: CHART IN EPIC     ROS: Negative unless specifically indicated above in HPI.    Current Outpatient Medications:    ALPRAZolam  (XANAX ) 0.5 MG tablet, Take 1 tablet (0.5 mg total) by mouth at bedtime as needed for sleep., Disp: 30 tablet, Rfl: 0   Cinnamon 500 MG capsule, , Disp: , Rfl:    cycloSPORINE (RESTASIS) 0.05 % ophthalmic emulsion, Apply 1 drop to eye as directed., Disp: , Rfl:    fluconazole  (DIFLUCAN ) 150 MG tablet, Take one po every day for one dose prn yeast infection repeat in three days if still with symptoms, Disp: 3 tablet, Rfl: 0   losartan  (COZAAR ) 50 MG tablet, Take 1 tablet (50 mg total) by mouth daily., Disp: 90 tablet, Rfl: 3   melatonin 3 MG TABS tablet, Take 3 mg by mouth at bedtime., Disp: , Rfl:    metaxalone  (SKELAXIN ) 800 MG tablet, TAKE 1 TABLET (800 MG TOTAL) BY MOUTH AS NEEDED FOR MUSCLE SPASMS., Disp: 30 tablet, Rfl: 0   Multiple Vitamin (MULTIVITAMIN) capsule, Take 1 capsule by mouth daily., Disp: , Rfl:    Omega-3 Fatty Acids (FISH OIL) 600 MG CAPS, Take 600 mg by mouth 2 (two) times daily., Disp: , Rfl:    rosuvastatin  (CRESTOR ) 5 MG tablet, Take 1 tablet (5 mg total) by mouth  daily., Disp: 90 tablet, Rfl: 3        Objective:        BP 118/82 (BP Location: Left Arm, Patient Position: Sitting, Cuff Size: Large)   Pulse 77   Temp 98.4 F (36.9 C) (Temporal)   Ht 5' 9 (1.753 m)   Wt 206 lb 12.8 oz (93.8 kg)   SpO2 99%   BMI 30.54 kg/m   Physical Exam GENERAL: No acute distress, well developed, well nourished. HEENT: Head is normocephalic and atraumatic. Eyes and nose are normal. NECK: Thyroid  is normal. CARDIOVASCULAR: Heart is normal. EXTREMITIES: No  swelling in the lower legs.  Wt Readings from Last 3 Encounters:  09/26/23 206 lb 12.8 oz (93.8 kg)  06/03/23 207 lb (93.9 kg)  11/28/22 207 lb 9.6 oz (94.2 kg)           Results RADIOLOGY CT scan: Arteriovenous malformation (AVM) identified  Assessment & Plan:xzxzs   Assessment and Plan Assessment & Plan Essential hypertension Hypertension managed with losartan . She self-increased the dose to 50 mg due to elevated readings during summer. Current readings are well-controlled at 118/76 mmHg with the 50 mg dose. - Continue losartan  50 mg daily as a single dose - Monitor blood pressure regularly  Hyperlipidemia Hyperlipidemia managed with rosuvastatin  5 mg. Reports improvement in muscle symptoms after switching from previous statin. - Continue rosuvastatin  5 mg daily - Monitor lipid levels  Fatigue and memory impairment, under evaluation Fatigue and memory impairment reported. Possible contributing factors include stress and hormonal changes. Discussed potential for premenopausal symptoms but no specific test for premenopause. - Discuss lifestyle modifications to manage stress and improve rest  Recurrent vulvovaginal candidiasis Recurrent vulvovaginal candidiasis, possibly related to partner's uncircumcised status. Discussed hygiene and preventive measures. - Order wet prep to differentiate between yeast infection and bacterial vaginitis - Prescribe fluconazole  150 mg, dispense 3 tablets, take one as needed and repeat in 3 days if symptoms persist - Advise on hygiene measures to prevent recurrence  Cerebral arteriovenous malformation, under surveillance Cerebral arteriovenous malformation under surveillance by specialist. Recent CT scan performed. - Continue annual surveillance with specialist  Recording duration: 18 minutes      Return in about 6 months (around 03/28/2024) for f/u CPE.     Ginger Patrick, MSN, APRN, FNP-C [facility]  Medicine

## 2023-09-26 NOTE — Patient Instructions (Signed)
  VISIT SUMMARY: During your visit, we reviewed your hypertension management, hyperlipidemia treatment, fatigue and memory issues, recurrent yeast infections, and the surveillance of your cerebral arteriovenous malformation (AVM).  YOUR PLAN: -ESSENTIAL HYPERTENSION: Hypertension, or high blood pressure, is being managed with losartan . Your current dose of 50 mg daily has kept your blood pressure readings well-controlled around 118/76 mmHg. Continue taking losartan  50 mg daily and monitor your blood pressure regularly.  -HYPERLIPIDEMIA: Hyperlipidemia, or high cholesterol, is being managed with rosuvastatin . You have reported fewer muscle-related side effects with this medication. Continue taking rosuvastatin  5 mg daily and monitor your lipid levels.  -FATIGUE AND MEMORY IMPAIRMENT: You have reported persistent tiredness and memory fog, which may be related to stress and hormonal changes. We discussed lifestyle modifications to help manage stress and improve rest.  -RECURRENT VULVOVAGINAL CANDIDIASIS: Recurrent yeast infections, possibly related to your partner's uncircumcised status, have been discussed. We will perform a wet prep to differentiate between a yeast infection and bacterial vaginitis. You are prescribed fluconazole  150 mg, with instructions to take one tablet as needed and repeat in 3 days if symptoms persist. Additionally, maintain good hygiene to prevent recurrence.  -CEREBRAL ARTERIOVENOUS MALFORMATION: A cerebral arteriovenous malformation (AVM) is an abnormal connection between arteries and veins in the brain. It is under surveillance by a specialist, and you should continue with annual check-ups.  INSTRUCTIONS: Please follow up with regular blood pressure monitoring and lipid level checks. Continue with annual surveillance for your cerebral arteriovenous malformation with your specialist. If you experience persistent symptoms of fatigue and memory impairment, consider discussing  further evaluations or lifestyle changes. For recurrent yeast infections, follow the prescribed medication regimen and maintain good hygiene practices.                      Contains text generated by Abridge.                                 Contains text generated by Abridge.

## 2023-09-29 ENCOUNTER — Ambulatory Visit: Payer: Self-pay | Admitting: Family

## 2023-09-30 LAB — WET PREP BY MOLECULAR PROBE
Candida species: NOT DETECTED
Gardnerella vaginalis: NOT DETECTED
MICRO NUMBER:: 16806436
SPECIMEN QUALITY:: ADEQUATE
Trichomonas vaginosis: NOT DETECTED

## 2023-12-23 ENCOUNTER — Encounter: Payer: Self-pay | Admitting: Family

## 2023-12-23 DIAGNOSIS — Z8669 Personal history of other diseases of the nervous system and sense organs: Secondary | ICD-10-CM

## 2023-12-24 MED ORDER — METHOCARBAMOL 500 MG PO TABS: 500.0000 mg | ORAL_TABLET | Freq: Two times a day (BID) | ORAL | 0 refills | Status: AC | PRN

## 2024-03-05 ENCOUNTER — Telehealth: Payer: Self-pay | Admitting: Family

## 2024-03-05 NOTE — Telephone Encounter (Signed)
 Copied from CRM [id number]. Topic: General - Other >> Mar 05, 2024  7:57 AM Ahlexyia S wrote: Reason for CRM: Pt is calling in requesting to speak to a nurse. Pt is wanting to know how she should proceed with scheduling. Pt is under the impression that she has to have physicals done once a year and her 6 month follow up where she completes labs. Pt just wants to confirm how she should proceed. Pt is requesting a callback.

## 2024-03-05 NOTE — Telephone Encounter (Signed)
 Spoke with pt. She has been scheduled for a 6 month follow up on 03/30/24 at 1220.

## 2024-03-30 ENCOUNTER — Ambulatory Visit: Admitting: Family

## 2024-09-08 ENCOUNTER — Encounter: Admitting: Family
